# Patient Record
Sex: Male | Born: 1995 | Race: Black or African American | Hispanic: No | Marital: Single | Smoking: Never smoker
Health system: Southern US, Community
[De-identification: ages and names within clinical notes are randomized; demographics above are authoritative.]

## PROBLEM LIST (undated history)

## (undated) DIAGNOSIS — J45909 Unspecified asthma, uncomplicated: Secondary | ICD-10-CM

## (undated) DIAGNOSIS — F909 Attention-deficit hyperactivity disorder, unspecified type: Secondary | ICD-10-CM

## (undated) HISTORY — PX: WISDOM TOOTH EXTRACTION: SHX21

---
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
## 2017-11-29 ENCOUNTER — Encounter (HOSPITAL_COMMUNITY): Payer: Self-pay | Admitting: Emergency Medicine

## 2017-11-29 ENCOUNTER — Ambulatory Visit (HOSPITAL_COMMUNITY)
Admission: EM | Admit: 2017-11-29 | Discharge: 2017-11-29 | Disposition: A | Discharge status: Home / Self Care | Payer: Medicaid Other | Attending: Physician Assistant | Admitting: Physician Assistant

## 2017-11-29 DIAGNOSIS — J4521 Mild intermittent asthma with (acute) exacerbation: Secondary | ICD-10-CM | POA: Diagnosis not present

## 2017-11-29 DIAGNOSIS — Z9109 Other allergy status, other than to drugs and biological substances: Secondary | ICD-10-CM | POA: Diagnosis not present

## 2017-11-29 HISTORY — DX: Unspecified asthma, uncomplicated: J45.909

## 2017-11-29 HISTORY — DX: Attention-deficit hyperactivity disorder, unspecified type: F90.9

## 2017-11-29 MED ORDER — PREDNISONE 5 MG (21) PO TBPK: ORAL_TABLET | ORAL | 0 refills | Status: DC

## 2017-11-29 NOTE — ED Provider Notes (Signed)
[facility]    CSN: [id number] Arrival date & time: 11/29/17  1605     History   Chief Complaint Chief Complaint  Patient presents with  . URI    HPI Ralph Sheppard is a 21 y.o. male.   Who carries a history of asthma presents with feelings of "cant breathe" x 1 week. It is worse in the am and late at night. He notes a mild cough without production. Mild clear sinus drainage. He has been using MDI which does help.       Past Medical History:  Diagnosis Date  . ADHD   . Asthma     Patient Active Problem List   Diagnosis Date Noted  . WEIGHT LOSS, ABNORMAL 06/30/2009  . PNEUMONIA, BILATERAL 02/09/2009  . URI 09/15/2008  . 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 ASTHMA 05/02/2007  . ADHD 03/13/2005  . 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 No pertinent surgical history.     Home Medications    Prior to Admission medications   Medication Sig Start Date End Date Taking? Authorizing Provider  predniSONE (STERAPRED UNI-PAK 21 TAB) 5 MG (21) TBPK tablet Take as directed on package for asthma exacerbation 11/29/17   Riki SheerYoung, Jerrianne Hartin G, PA-C    Family History Family History  Problem Relation Age of Onset  . Asthma Mother   . Diabetes Mother   . Diabetes Father   . Hypertension Father     Social History Social History   Tobacco Use  . Smoking status: Never Smoker  . Smokeless tobacco: Never Used  Substance Use Topics  . Alcohol use: Not on file  . Drug use: Yes    Frequency: 2.0 times per week     Allergies   Patient has no known allergies.   Review of Systems Review of Systems  Constitutional: Positive for fatigue. Negative for chills and fever.  HENT: Positive for postnasal drip and rhinorrhea. Negative for congestion and sinus pain.   Eyes: Negative for discharge.  Respiratory: Positive for cough and shortness of breath. Negative for apnea and wheezing.   Musculoskeletal: Negative for back pain.    Skin: Negative.   Neurological: Negative for light-headedness.  Psychiatric/Behavioral: Negative.      Physical Exam Triage Vital Signs ED Triage Vitals [11/29/17 1709]  Enc Vitals Group     BP (!) 121/57     Pulse Rate 67     Resp      Temp 98.7 F (37.1 C)     Temp Source Oral     SpO2 100 %     Weight      Height      Head Circumference      Peak Flow      Pain Score      Pain Loc      Pain Edu?      Excl. in GC?    No data found.  Updated Vital Signs BP (!) 121/57 (BP Location: Left Arm)   Pulse 67   Temp 98.7 F (37.1 C) (Oral)   SpO2 100%   Visual Acuity Right Eye Distance:   Left Eye Distance:   Bilateral Distance:    Right Eye Near:   Left Eye Near:    Bilateral Near:     Physical Exam  Constitutional: He is oriented to person, place, and time. He appears well-developed and well-nourished.  HENT:  Right Ear: External ear normal.  Left Ear: External ear normal.  Mouth/Throat: Oropharynx is clear and moist.  Cardiovascular: Normal rate and regular rhythm.  Pulmonary/Chest: Effort normal.  Very fine exp wheeze in extreme bases, no crackles  Neurological: He is alert and oriented to person, place, and time.  Skin: Skin is warm and dry.  Psychiatric: His behavior is normal.  Nursing note and vitals reviewed.    UC Treatments / Results  Labs (all labs ordered are listed, but only abnormal results are displayed) Labs Reviewed - No data to display  EKG  EKG Interpretation None       Radiology No results found.  Procedures Procedures (including critical care time)  Medications Ordered in UC Medications - No data to display   Initial Impression / Assessment and Plan / UC Course  I have reviewed the triage vital signs and the nursing notes.  Pertinent labs & imaging results that were available during my care of the patient were reviewed by me and considered in my medical decision making (see chart for details).    No respiratory  compromise is noted. Treat with short course of prednisone, start daily Zyrtec. Delsym for cough and continued use of MDI. He needs to establish PCP care for chronic asthma needs. FU here if needed.   Final Clinical Impressions(s) / UC Diagnoses   Final diagnoses:  Mild intermittent asthma with acute exacerbation  Environmental allergies    ED Discharge Orders        Ordered    predniSONE (STERAPRED UNI-PAK 21 TAB) 5 MG (21) TBPK tablet     11/29/17 1751       Controlled Substance Prescriptions [place] Controlled Substance Registry consulted? Not Applicable   Riki SheerYoung, Elda Dunkerson G, PA-C 11/29/17 1756

## 2017-11-29 NOTE — Discharge Instructions (Signed)
Start Zyrtec daily for prevention. Continue to use inhaler every 6 hours as needed. Take the prednisone to help with current symptoms. If any worsening symptoms f/u but overall suggest establishment with a PCP to manage this. Stay hydrated and feel better. May use Delsym OTC for cough every 12 hours.

## 2017-11-29 NOTE — ED Triage Notes (Signed)
Patient c/o nonproductive cough that is hurting his chest and causing sob x 1 week. Pt reports he has been taking over the counter cold medication at night to help sleep. Pt is in NAD.

## 2017-12-18 ENCOUNTER — Ambulatory Visit (HOSPITAL_COMMUNITY)
Admission: EM | Admit: 2017-12-18 | Discharge: 2017-12-18 | Disposition: A | Discharge status: Home / Self Care | Payer: Medicaid Other

## 2017-12-18 ENCOUNTER — Encounter (HOSPITAL_COMMUNITY): Payer: Self-pay | Admitting: Emergency Medicine

## 2017-12-18 ENCOUNTER — Other Ambulatory Visit: Payer: Self-pay

## 2017-12-18 DIAGNOSIS — J4531 Mild persistent asthma with (acute) exacerbation: Secondary | ICD-10-CM | POA: Diagnosis not present

## 2017-12-18 DIAGNOSIS — R05 Cough: Secondary | ICD-10-CM | POA: Diagnosis not present

## 2017-12-18 DIAGNOSIS — J069 Acute upper respiratory infection, unspecified: Secondary | ICD-10-CM | POA: Diagnosis not present

## 2017-12-18 DIAGNOSIS — R062 Wheezing: Secondary | ICD-10-CM

## 2017-12-18 DIAGNOSIS — R0602 Shortness of breath: Secondary | ICD-10-CM

## 2017-12-18 MED ORDER — TRIAMCINOLONE ACETONIDE 40 MG/ML IJ SUSP
INTRAMUSCULAR | Status: AC
  Filled 2017-12-18: qty 1

## 2017-12-18 MED ORDER — ALBUTEROL SULFATE (2.5 MG/3ML) 0.083% IN NEBU
INHALATION_SOLUTION | RESPIRATORY_TRACT | Status: AC
  Filled 2017-12-18: qty 3

## 2017-12-18 MED ORDER — PREDNISONE 20 MG PO TABS: ORAL_TABLET | ORAL | 0 refills | Status: DC

## 2017-12-18 MED ORDER — TRIAMCINOLONE ACETONIDE 40 MG/ML IJ SUSP
40.0000 mg | Freq: Once | INTRAMUSCULAR | Status: AC
  Administered 2017-12-18: 40 mg via INTRAMUSCULAR

## 2017-12-18 MED ORDER — ALBUTEROL SULFATE (2.5 MG/3ML) 0.083% IN NEBU
2.5000 mg | INHALATION_SOLUTION | Freq: Once | RESPIRATORY_TRACT | Status: AC
  Administered 2017-12-18: 2.5 mg via RESPIRATORY_TRACT

## 2017-12-18 NOTE — Discharge Instructions (Addendum)
Take the prednisone as directed. Also take either Zyrtec or Allegra daily for drainage and sneezing. Use the albuterol inhaler 2 puffs every 4 hours as needed for shortness of breath or cough. Be sure to call your primary care provider to make an appointment for next week.

## 2017-12-18 NOTE — ED Provider Notes (Signed)
[facility]    CSN: [id number] Arrival date & time: 12/18/17  1621     History   Chief Complaint Chief Complaint  Patient presents with  . URI    HPI Ralph Sheppard is a 21 y.o. male.   21 year old male with a history of asthma presents with chief complaint of cough for 3 weeks. He has been using his inhaler more frequently. His also complaining of sneezing, runny nose and PND. He said he had a prednisone tablet that is been taking one daily at bedtime for one week. Uncertain as to the origin but it is in the record that he was seen earlier this month and given a Sterapred pack. He states he is using his a beer all inhaler 6-8 times per day.      Past Medical History:  Diagnosis Date  . ADHD   . Asthma     Patient Active Problem List   Diagnosis Date Noted  . WEIGHT LOSS, ABNORMAL 06/30/2009  . PNEUMONIA, BILATERAL 02/09/2009  . URI 09/15/2008  . 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 ASTHMA 05/02/2007  . ADHD 03/13/2005  . 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 No pertinent surgical history.     Home Medications    Prior to Admission medications   Medication Sig Start Date End Date Taking? Authorizing Provider  ALBUTEROL IN Inhale into the lungs.   Yes [provider]  cetirizine (ZYRTEC) 10 MG tablet Take 10 mg by mouth daily.   Yes [provider]  predniSONE (DELTASONE) 20 MG tablet Take 2 tabs daily for 5 days, then 1 tab daily for 5 days. Take with food. 12/18/17   Hayden RasmussenMabe, Dashon Mcintire, NP    Family History Family History  Problem Relation Age of Onset  . Asthma Mother   . Diabetes Mother   . Diabetes Father   . Hypertension Father     Social History Social History   Tobacco Use  . Smoking status: Never Smoker  . Smokeless tobacco: Never Used  Substance Use Topics  . Alcohol use: Not on file  . Drug use: Yes    Frequency: 2.0 times per week     Allergies   Patient has no known  allergies.   Review of Systems Review of Systems  Constitutional: Negative.   HENT:       As per history of present illness  Respiratory: Positive for cough, shortness of breath and wheezing.   Gastrointestinal: Negative.   Neurological: Negative.   All other systems reviewed and are negative.    Physical Exam Triage Vital Signs ED Triage Vitals  Enc Vitals Group     BP 12/18/17 1742 123/63     Pulse Rate 12/18/17 1742 61     Resp 12/18/17 1742 18     Temp 12/18/17 1742 98.7 F (37.1 C)     Temp Source 12/18/17 1742 Oral     SpO2 12/18/17 1742 100 %     Weight 12/18/17 1744 140 lb 4 oz (63.6 kg)     Height --      Head Circumference --      Peak Flow --      Pain Score --      Pain Loc --      Pain Edu? --      Excl. in GC? --    No data found.  Updated Vital Signs BP 123/63 (BP Location: Left Arm)   Pulse 61  Temp 98.7 F (37.1 C) (Oral)   Resp 18   Wt 140 lb 4 oz (63.6 kg)   SpO2 100%   Visual Acuity Right Eye Distance:   Left Eye Distance:   Bilateral Distance:    Right Eye Near:   Left Eye Near:    Bilateral Near:     Physical Exam  Constitutional: He is oriented to person, place, and time. No distress.  HENT:  Oropharynx with minor erythema, clear PND. No exudate  Eyes: EOM are normal. Pupils are equal, round, and reactive to light.  Neck: Normal range of motion. Neck supple.  Cardiovascular: Normal rate, regular rhythm and normal heart sounds.  Pulmonary/Chest: Effort normal and breath sounds normal. No respiratory distress.  Tidal volume is clear. Able to take deep breaths. Expiratory phase equals expiratory phase. No wheezes except when coughing.  Musculoskeletal: Normal range of motion. He exhibits no edema.  Lymphadenopathy:    He has no cervical adenopathy.  Neurological: He is alert and oriented to person, place, and time.  Skin: Skin is warm and dry.  Nursing note and vitals reviewed.    UC Treatments / Results  Labs (all labs  ordered are listed, but only abnormal results are displayed) Labs Reviewed - No data to display  EKG  EKG Interpretation None       Radiology No results found.  Procedures Procedures (including critical care time)  Medications Ordered in UC Medications  albuterol (PROVENTIL) (2.5 MG/3ML) 0.083% nebulizer solution 2.5 mg (2.5 mg Nebulization Given 12/18/17 1844)  triamcinolone acetonide (KENALOG-40) injection 40 mg (40 mg Intramuscular Given 12/18/17 1845)     Initial Impression / Assessment and Plan / UC Course  I have reviewed the triage vital signs and the nursing notes.  Pertinent labs & imaging results that were available during my care of the patient were reviewed by me and considered in my medical decision making (see chart for details).    Take the prednisone as directed. Also take either Zyrtec or Allegra daily for drainage and sneezing. Use the albuterol inhaler 2 puffs every 4 hours as needed for shortness of breath or cough. Be sure to call your primary care provider to make an appointment for next week.  Post albuterol neb lungs are clear. No wheezing.  Final Clinical Impressions(s) / UC Diagnoses   Final diagnoses:  Acute upper respiratory infection  Mild persistent asthma with acute exacerbation    ED Discharge Orders        Ordered    predniSONE (DELTASONE) 20 MG tablet     12/18/17 1848       Controlled Substance Prescriptions [place] Controlled Substance Registry consulted? Not Applicable   Hayden RasmussenMabe, Gail Vendetti, NP 12/18/17 [phone]

## 2017-12-18 NOTE — ED Triage Notes (Signed)
Complains of cough.  Cough started 2 weeks ago.  Patient complains of sneezing, runny nose, heavy breathing, and using inhalers more often

## 2018-11-10 ENCOUNTER — Encounter (HOSPITAL_COMMUNITY): Payer: Self-pay | Admitting: Emergency Medicine

## 2018-11-10 ENCOUNTER — Emergency Department (HOSPITAL_COMMUNITY)
Admission: EM | Admit: 2018-11-10 | Discharge: 2018-11-10 | Disposition: A | Discharge status: Home / Self Care | Payer: Medicaid Other | Attending: Emergency Medicine | Admitting: Emergency Medicine

## 2018-11-10 ENCOUNTER — Other Ambulatory Visit: Payer: Self-pay

## 2018-11-10 DIAGNOSIS — B9789 Other viral agents as the cause of diseases classified elsewhere: Secondary | ICD-10-CM

## 2018-11-10 DIAGNOSIS — J069 Acute upper respiratory infection, unspecified: Secondary | ICD-10-CM

## 2018-11-10 DIAGNOSIS — J45909 Unspecified asthma, uncomplicated: Secondary | ICD-10-CM | POA: Insufficient documentation

## 2018-11-10 NOTE — ED Triage Notes (Signed)
Pt has a cough x 4 days. Stated that he has been using OTC meds with good results. He needs to be cleared to return to work

## 2018-11-10 NOTE — Discharge Instructions (Signed)
Your symptoms are likely caused by a viral upper respiratory infection. Antibiotics are not helpful in treating viral infection, the virus should run its course in about 5-7 days. Please make sure you are drinking plenty of fluids. You can treat your symptoms supportively with tylenol/ibuprofen for fevers and pains, Zyrtec and Flonase to help with nasal congestion, and over the counter cough syrups and throat lozenges to help with cough. If your symptoms are not improving please follow up with you Primary doctor.   If you develop persistent fevers, shortness of breath or difficulty breathing, chest pain, severe headache and neck pain, persistent nausea and vomiting or other new or concerning symptoms return to the Emergency department.  

## 2018-11-10 NOTE — ED Provider Notes (Signed)
[facility]-EMERGENCY DEPT Provider Note   CSN: [id number] Arrival date & time: 11/10/18  1505     History   Chief Complaint Chief Complaint  Patient presents with  . Cough    HPI Ralph Sheppard is a 22 y.o. male.  Ralph Sheppard is a 22 y.o. Male with a history of ADHD and asthma who presents to the ED for evaluation of cough.  Reports he has had cough and nasal congestion for the past 4 days, it was much worse over the weekend but has been improving today.  He reports cough is nonproductive.  Some associated congestion, no sore throat chest pain or shortness of breath, no abdominal pain, nausea or vomiting.  He has not had any fevers or chills.  Reports he has been using over-the-counter medications such as Mucinex with good improvement in his symptoms and is requesting a note saying he can return to work today.     Past Medical History:  Diagnosis Date  . ADHD   . Asthma     Patient Active Problem List   Diagnosis Date Noted  . WEIGHT LOSS, ABNORMAL 06/30/2009  . PNEUMONIA, BILATERAL 02/09/2009  . URI 09/15/2008  . 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 ASTHMA 05/02/2007  . ADHD 03/13/2005  . 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 WISDOM TOOTH EXTRACTION          Home Medications    Prior to Admission medications   Medication Sig Start Date End Date Taking? Authorizing Provider  ALBUTEROL IN Inhale into the lungs.    [provider]  cetirizine (ZYRTEC) 10 MG tablet Take 10 mg by mouth daily.    [provider]  predniSONE (DELTASONE) 20 MG tablet Take 2 tabs daily for 5 days, then 1 tab daily for 5 days. Take with food. 12/18/17   Hayden RasmussenMabe, David, NP    Family History Family History  Problem Relation Age of Onset  . Asthma Mother   . Diabetes Mother   . Diabetes Father   . Hypertension Father     Social History Social History   Tobacco Use  .  Smoking status: Never Smoker  . Smokeless tobacco: Never Used  Substance Use Topics  . Alcohol use: Never    Frequency: Never  . Drug use: Never     Allergies   Patient has no known allergies.   Review of Systems Review of Systems  Constitutional: Negative for chills and fever.  HENT: Positive for congestion, postnasal drip and sneezing. Negative for ear pain, rhinorrhea, sinus pressure and sore throat.   Respiratory: Positive for cough. Negative for shortness of breath.   Cardiovascular: Negative for chest pain.  Gastrointestinal: Negative for abdominal pain and nausea.  Musculoskeletal: Negative for arthralgias and myalgias.  Skin: Negative for color change and rash.  Neurological: Negative for headaches.     Physical Exam Updated Vital Signs BP 117/77 (BP Location: Left Arm)   Pulse 68   Temp 98.5 F (36.9 C) (Oral)   Resp 14   Ht 5\' 9"  (1.753 m)   Wt 63.5 kg   SpO2 100%   BMI 20.67 kg/m   Physical Exam  Constitutional: He appears well-developed and well-nourished. He does not appear ill. No distress.  HENT:  Head: Normocephalic and atraumatic.  Mouth/Throat: Oropharynx is clear and moist.  TMs clear with good landmarks, moderate nasal mucosa edema with clear rhinorrhea, posterior oropharynx clear and  moist, with some erythema, no edema or exudates, uvula midline  Eyes: Right eye exhibits no discharge. Left eye exhibits no discharge.  Neck: Neck supple.  No rigidity  Cardiovascular: Normal rate, regular rhythm, normal heart sounds and intact distal pulses.  Pulmonary/Chest: Effort normal and breath sounds normal. No respiratory distress.  Respirations equal and unlabored, patient able to speak in full sentences, lungs clear to auscultation bilaterally, occasional cough during exam  Abdominal: Soft. Bowel sounds are normal. He exhibits no distension and no mass. There is no tenderness. There is no guarding.  Abdomen soft, nondistended, nontender to palpation in  all quadrants without guarding or peritoneal signs  Musculoskeletal: He exhibits no deformity.  Lymphadenopathy:    He has no cervical adenopathy.  Neurological: He is alert.  Skin: Skin is warm and dry. Capillary refill takes less than 2 seconds. He is not diaphoretic.  Nursing note and vitals reviewed.    ED Treatments / Results  Labs (all labs ordered are listed, but only abnormal results are displayed) Labs Reviewed - No data to display  EKG None  Radiology No results found.  Procedures Procedures (including critical care time)  Medications Ordered in ED Medications - No data to display   Initial Impression / Assessment and Plan / ED Course  I have reviewed the triage vital signs and the nursing notes.  Pertinent labs & imaging results that were available during my care of the patient were reviewed by me and considered in my medical decision making (see chart for details).  Pt presents with nasal congestion and cough. Pt is well appearing and vitals are normal. Lungs CTA on exam.  Patients symptoms are consistent with URI, likely viral etiology. Discussed that antibiotics are not indicated for viral infections.  Symptoms already improving with over-the-counter medications at home, patient requesting notes that he can return to work.  Pt will be discharged with continued symptomatic treatment.  Verbalizes understanding and is agreeable with plan. Pt is hemodynamically stable & in NAD prior to dc.   Final Clinical Impressions(s) / ED Diagnoses   Final diagnoses:  Viral URI with cough    ED Discharge Orders    None       Legrand Rams 11/10/18 1545    Azalia Bilis, MD 11/10/18 [id number]

## 2018-11-11 ENCOUNTER — Ambulatory Visit (HOSPITAL_COMMUNITY)
Admission: EM | Admit: 2018-11-11 | Discharge: 2018-11-11 | Disposition: A | Discharge status: Home / Self Care | Payer: Self-pay | Attending: Family Medicine | Admitting: Family Medicine

## 2018-11-11 ENCOUNTER — Encounter (HOSPITAL_COMMUNITY): Payer: Self-pay | Admitting: Emergency Medicine

## 2018-11-11 DIAGNOSIS — J069 Acute upper respiratory infection, unspecified: Secondary | ICD-10-CM

## 2018-11-11 DIAGNOSIS — B9789 Other viral agents as the cause of diseases classified elsewhere: Secondary | ICD-10-CM

## 2018-11-11 MED ORDER — HYDROCODONE-HOMATROPINE 5-1.5 MG/5ML PO SYRP: 5.0000 mL | ORAL_SOLUTION | Freq: Four times a day (QID) | ORAL | 0 refills | Status: DC | PRN

## 2018-11-11 NOTE — ED Provider Notes (Signed)
[facility]   [id number] 11/11/18 Arrival Time: 1214  ASSESSMENT & PLAN:  1. Viral URI with cough   No concern for pneumonia. Discussed.  Meds ordered this encounter  Medications  . HYDROcodone-homatropine (HYCODAN) 5-1.5 MG/5ML syrup    Sig: Take 5 mLs by mouth every 6 (six) hours as needed for cough.    Dispense:  90 mL    Refill:  0   Cough medication sedation precautions. Discussed typical duration of symptoms. OTC symptom care as needed. Ensure adequate fluid intake and rest. May f/u with PCP or here as needed.  Reviewed expectations re: course of current medical issues. Questions answered. Outlined signs and symptoms indicating need for more acute intervention. Patient verbalized understanding. After Visit Summary given.   SUBJECTIVE: History from: patient and mother. Seen in ED yesterday. Note reviewed. No specific change in his symptoms. Cough is bothering him the most; affecting sleep.  Ralph Sheppard is a 22 y.o. male who presents with complaint of nasal congestion, post-nasal drainage, and a persistent dry cough. Onset abrupt, about 4 days ago. Overall with fatigue and with mild body aches. SOB: none. Wheezing: none. Fever: yes, questions subjective with chills. Overall normal PO intake without n/v. Sick contacts: no. No specific or significant aggravating or alleviating factors reported. OTC treatment: Mucinex without relief. No prescription medications.  Received flu shot this year: no.  Social History   Tobacco Use  Smoking Status Never Smoker  Smokeless Tobacco Never Used    ROS: As per HPI.   OBJECTIVE:  Vitals:   11/11/18 1348  BP: 115/74  Pulse: 60  Resp: 16  Temp: 98.2 F (36.8 C)  SpO2: 100%     General appearance: alert; appears fatigued HEENT: nasal congestion; clear runny nose; throat irritation secondary to post-nasal drainage Neck: supple without LAD CV: RRR Lungs: unlabored respirations, symmetrical air entry without  wheezing; cough: moderate Psychological: alert and cooperative; normal mood and affect   No Known Allergies  Past Medical History:  Diagnosis Date  . ADHD   . Asthma    Family History  Problem Relation Age of Onset  . Asthma Mother   . Diabetes Mother   . Diabetes Father   . Hypertension Father    Social History   Socioeconomic History  . Marital status: Single    Spouse name: Not on file  . Number of children: Not on file  . Years of education: Not on file  . Highest education level: Not on file  Occupational History  . Not on file  Social Needs  . Financial resource strain: Not on file  . Food insecurity:    Worry: Not on file    Inability: Not on file  . Transportation needs:    Medical: Not on file    Non-medical: Not on file  Tobacco Use  . Smoking status: Never Smoker  . Smokeless tobacco: Never Used  Substance and Sexual Activity  . Alcohol use: Never    Frequency: Never  . Drug use: Never  . Sexual activity: Not on file  Lifestyle  . Physical activity:    Days per week: Not on file    Minutes per session: Not on file  . Stress: Not on file  Relationships  . Social connections:    Talks on phone: Not on file    Gets together: Not on file    Attends religious service: Not on file    Active member of club or organization: Not on file  Attends meetings of clubs or organizations: Not on file    Relationship status: Not on file  . Intimate partner violence:    Fear of current or ex partner: Not on file    Emotionally abused: Not on file    Physically abused: Not on file    Forced sexual activity: Not on file  Other Topics Concern  . Not on file  Social History Narrative  . Not on file           Mardella Layman, MD 11/11/18 1421

## 2018-11-11 NOTE — Discharge Instructions (Signed)
Be aware, your cough medication may cause drowsiness. Please do not drive, operate heavy machinery or make important decisions while on this medication, it can cloud your judgement.  Follow up with your primary care doctor or here if you are not seeing improvement of your symptoms over the next several days, sooner if you feel you are worsening.  Caring for yourself: Get plenty of rest. Drink plenty of fluids, enough so that your urine is light yellow or clear like water. If you have kidney, heart, or liver disease and have to limit fluids, talk with your doctor before you increase the amount of fluids you drink. Take an over-the-counter pain medicine if needed, such as acetaminophen (Tylenol), ibuprofen (Advil, Motrin), or naproxen (Aleve), to relieve fever, headache, and muscle aches. Read and follow all instructions on the label. No one younger than 20 should take aspirin. It has been linked to Reye syndrome, a serious illness. Before you use over the counter cough and cold medicines, check the label. These medicines may not be safe for children younger than age 6 or for people with certain health problems. If the skin around your nose and lips becomes sore, put some petroleum jelly on the area.  Avoid spreading a virus: Wash your hands regularly, and keep your hands away from your face.  Stay home from school, work, and other public places until you are feeling better and your fever has been gone for at least 24 hours. The fever needs to have gone away on its own without the help of medicine.  

## 2018-11-11 NOTE — ED Triage Notes (Signed)
Pt c/o cough x1 week, was seen in ER for the same yseterday.

## 2019-12-14 ENCOUNTER — Ambulatory Visit (HOSPITAL_COMMUNITY)
Admission: EM | Admit: 2019-12-14 | Discharge: 2019-12-14 | Disposition: A | Discharge status: Home / Self Care | Payer: HRSA Program | Attending: Family Medicine | Admitting: Family Medicine

## 2019-12-14 ENCOUNTER — Other Ambulatory Visit: Payer: Self-pay

## 2019-12-14 ENCOUNTER — Encounter (HOSPITAL_COMMUNITY): Payer: Self-pay

## 2019-12-14 DIAGNOSIS — Z20822 Contact with and (suspected) exposure to covid-19: Secondary | ICD-10-CM

## 2019-12-14 DIAGNOSIS — Z20828 Contact with and (suspected) exposure to other viral communicable diseases: Secondary | ICD-10-CM | POA: Insufficient documentation

## 2019-12-14 NOTE — ED Triage Notes (Addendum)
Pt. States his dad tested POSITIVE yesterday for COVID & needs to be tested because of them living in the same household. Needs a work note saying he was tested, he denies ANY symptoms.

## 2019-12-14 NOTE — Discharge Instructions (Signed)
Person Under Monitoring Name: Ralph Sheppard  Location: [place] [address]   Infection Prevention Recommendations for Individuals Confirmed to have, or Being Evaluated for, 2019 Novel Coronavirus (COVID-19) Infection Who Receive Care at Home  Individuals who are confirmed to have, or are being evaluated for, COVID-19 should follow the prevention steps below until a healthcare provider or local or state health department says they can return to normal activities.  Stay home except to get medical care You should restrict activities outside your home, except for getting medical care. Do not go to work, school, or public areas, and do not use public transportation or taxis.  Call ahead before visiting your doctor Before your medical appointment, call the healthcare provider and tell them that you have, or are being evaluated for, COVID-19 infection. This will help the healthcare provider's office take steps to keep other people from getting infected. Ask your healthcare provider to call the local or state health department.  Monitor your symptoms Seek prompt medical attention if your illness is worsening (e.g., difficulty breathing). Before going to your medical appointment, call the healthcare provider and tell them that you have, or are being evaluated for, COVID-19 infection. Ask your healthcare provider to call the local or state health department.  Wear a facemask You should wear a facemask that covers your nose and mouth when you are in the same room with other people and when you visit a healthcare provider. People who live with or visit you should also wear a facemask while they are in the same room with you.  Separate yourself from other people in your home As much as possible, you should stay in a different room from other people in your home. Also, you should use a separate bathroom, if available.  Avoid sharing household items You should not share  dishes, drinking glasses, cups, eating utensils, towels, bedding, or other items with other people in your home. After using these items, you should wash them thoroughly with soap and water.  Cover your coughs and sneezes Cover your mouth and nose with a tissue when you cough or sneeze, or you can cough or sneeze into your sleeve. Throw used tissues in a lined trash can, and immediately wash your hands with soap and water for at least 20 seconds or use an alcohol-based hand rub.  Wash your [facility] your hands often and thoroughly with soap and water for at least 20 seconds. You can use an alcohol-based hand sanitizer if soap and water are not available and if your hands are not visibly dirty. Avoid touching your eyes, nose, and mouth with unwashed hands.   Prevention Steps for Caregivers and Household Members of Individuals Confirmed to have, or Being Evaluated for, COVID-19 Infection Being Cared for in the Home  If you live with, or provide care at home for, a person confirmed to have, or being evaluated for, COVID-19 infection please follow these guidelines to prevent infection:  Follow healthcare provider's instructions Make sure that you understand and can help the patient follow any healthcare provider instructions for all care.  Provide for the patient's basic needs You should help the patient with basic needs in the home and provide support for getting groceries, prescriptions, and other personal needs.  Monitor the patient's symptoms If they are getting sicker, call his or her medical provider and tell them that the patient has, or is being evaluated for, COVID-19 infection. This will help the healthcare provider's office  take steps to keep other people from getting infected. Ask the healthcare provider to call the local or state health department.  Limit the number of people who have contact with the patient If possible, have only one caregiver for the patient. Other  household members should stay in another home or place of residence. If this is not possible, they should stay in another room, or be separated from the patient as much as possible. Use a separate bathroom, if available. Restrict visitors who do not have an essential need to be in the home.  Keep older adults, very young children, and other sick people away from the patient Keep older adults, very young children, and those who have compromised immune systems or chronic health conditions away from the patient. This includes people with chronic heart, lung, or kidney conditions, diabetes, and cancer.  Ensure good ventilation Make sure that shared spaces in the home have good air flow, such as from an air conditioner or an opened window, weather permitting.  Wash your hands often Wash your hands often and thoroughly with soap and water for at least 20 seconds. You can use an alcohol based hand sanitizer if soap and water are not available and if your hands are not visibly dirty. Avoid touching your eyes, nose, and mouth with unwashed hands. Use disposable paper towels to dry your hands. If not available, use dedicated cloth towels and replace them when they become wet.  Wear a facemask and gloves Wear a disposable facemask at all times in the room and gloves when you touch or have contact with the patient's blood, body fluids, and/or secretions or excretions, such as sweat, saliva, sputum, nasal mucus, vomit, urine, or feces.  Ensure the mask fits over your nose and mouth tightly, and do not touch it during use. Throw out disposable facemasks and gloves after using them. Do not reuse. Wash your hands immediately after removing your facemask and gloves. If your personal clothing becomes contaminated, carefully remove clothing and launder. Wash your hands after handling contaminated clothing. Place all used disposable facemasks, gloves, and other waste in a lined container before disposing them with  other household waste. Remove gloves and wash your hands immediately after handling these items.  Do not share dishes, glasses, or other household items with the patient Avoid sharing household items. You should not share dishes, drinking glasses, cups, eating utensils, towels, bedding, or other items with a patient who is confirmed to have, or being evaluated for, COVID-19 infection. After the person uses these items, you should wash them thoroughly with soap and water.  Wash laundry thoroughly Immediately remove and wash clothes or bedding that have blood, body fluids, and/or secretions or excretions, such as sweat, saliva, sputum, nasal mucus, vomit, urine, or feces, on them. Wear gloves when handling laundry from the patient. Read and follow directions on labels of laundry or clothing items and detergent. In general, wash and dry with the warmest temperatures recommended on the label.  Clean all areas the individual has used often Clean all touchable surfaces, such as counters, tabletops, doorknobs, bathroom fixtures, toilets, phones, keyboards, tablets, and bedside tables, every day. Also, clean any surfaces that may have blood, body fluids, and/or secretions or excretions on them. Wear gloves when cleaning surfaces the patient has come in contact with. Use a diluted bleach solution (e.g., dilute bleach with 1 part bleach and 10 parts water) or a household disinfectant with a label that says EPA-registered for coronaviruses. To make a bleach  solution at home, add 1 tablespoon of bleach to 1 quart (4 cups) of water. For a larger supply, add  cup of bleach to 1 gallon (16 cups) of water. Read labels of cleaning products and follow recommendations provided on product labels. Labels contain instructions for safe and effective use of the cleaning product including precautions you should take when applying the product, such as wearing gloves or eye protection and making sure you have good ventilation  during use of the product. Remove gloves and wash hands immediately after cleaning.  Monitor yourself for signs and symptoms of illness Caregivers and household members are considered close contacts, should monitor their health, and will be asked to limit movement outside of the home to the extent possible. Follow the monitoring steps for close contacts listed on the symptom monitoring form.   ? If you have additional questions, contact your local health department or call the epidemiologist on call at [phone] (available 24/7). ? This guidance is subject to change. For the most up-to-date guidance from [facility], please refer to their website: [email or web address]

## 2019-12-15 NOTE — ED Provider Notes (Signed)
[name]    CSN: [id number] Arrival date & time: 12/14/19  1346      History   Chief Complaint Chief Complaint  Patient presents with  . COVID exposure    HPI Ralph Sheppard is a 23 y.o. male history of asthma presenting today for Covid testing.  Patient has had exposure to Covid.  His dad tested positive for Covid recently and has been living with them.  He has not developed any symptoms of cough, congestion or sore throat.  Denies any fevers chills or body aches.  HPI  Past Medical History:  Diagnosis Date  . ADHD   . Asthma     Patient Active Problem List   Diagnosis Date Noted  . WEIGHT LOSS, ABNORMAL 06/30/2009  . PNEUMONIA, BILATERAL 02/09/2009  . URI 09/15/2008  . 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 ASTHMA 05/02/2007  . ADHD 03/13/2005  . 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 WISDOM TOOTH EXTRACTION         Home Medications    Prior to Admission medications   Medication Sig Start Date End Date Taking? Authorizing Provider  ALBUTEROL IN Inhale into the lungs.    [provider]  cetirizine (ZYRTEC) 10 MG tablet Take 10 mg by mouth daily.    [provider]  HYDROcodone-homatropine (HYCODAN) 5-1.5 MG/5ML syrup Take 5 mLs by mouth every 6 (six) hours as needed for cough. 11/11/18   Vanessa Kick, MD  predniSONE (DELTASONE) 20 MG tablet Take 2 tabs daily for 5 days, then 1 tab daily for 5 days. Take with food. Patient not taking: Reported on 11/11/2018 12/18/17   Janne Napoleon, NP    Family History Family History  Problem Relation Age of Onset  . Asthma Mother   . Diabetes Mother   . Diabetes Father   . Hypertension Father     Social History Social History   Tobacco Use  . Smoking status: Never Smoker  . Smokeless tobacco: Never Used  Substance Use Topics  . Alcohol use: Never  . Drug use: Never     Allergies   Patient has no known  allergies.   Review of Systems Review of Systems  Constitutional: Negative for activity change, appetite change, chills, fatigue and fever.  HENT: Negative for congestion, ear pain, rhinorrhea, sinus pressure, sore throat and trouble swallowing.   Eyes: Negative for discharge and redness.  Respiratory: Negative for cough, chest tightness and shortness of breath.   Cardiovascular: Negative for chest pain.  Gastrointestinal: Negative for abdominal pain, diarrhea, nausea and vomiting.  Musculoskeletal: Negative for myalgias.  Skin: Negative for rash.  Neurological: Negative for dizziness, light-headedness and headaches.     Physical Exam Triage Vital Signs ED Triage Vitals  Enc Vitals Group     BP 12/14/19 1416 108/76     Pulse Rate 12/14/19 1416 64     Resp 12/14/19 1416 15     Temp 12/14/19 1416 98.7 F (37.1 C)     Temp Source 12/14/19 1416 Oral     SpO2 12/14/19 1416 99 %     Weight 12/14/19 1412 70 lb 12.8 oz (32.1 kg)     Height --      Head Circumference --      Peak Flow --      Pain Score 12/14/19 1411 0     Pain Loc --      Pain Edu? --  Excl. in [place]? --    No data found.  Updated Vital Signs BP 108/76 (BP Location: Right Arm)   Pulse 64   Temp 98.7 F (37.1 C) (Oral)   Resp 15   Wt 70 lb 12.8 oz (32.1 kg)   SpO2 99%   BMI 10.46 kg/m   Visual Acuity Right Eye Distance:   Left Eye Distance:   Bilateral Distance:    Right Eye Near:   Left Eye Near:    Bilateral Near:     Physical Exam Vitals and nursing note reviewed.  Constitutional:      Appearance: He is well-developed.     Comments: No acute distress  HENT:     Head: Normocephalic and atraumatic.     Nose: Nose normal.  Eyes:     Conjunctiva/sclera: Conjunctivae normal.  Cardiovascular:     Rate and Rhythm: Normal rate.  Pulmonary:     Effort: Pulmonary effort is normal. No respiratory distress.     Comments: Breathing comfortably at rest, CTABL, no wheezing, rales or other  adventitious sounds auscultated Abdominal:     General: There is no distension.  Musculoskeletal:        General: Normal range of motion.     Cervical back: Neck supple.  Skin:    General: Skin is warm and dry.  Neurological:     Mental Status: He is alert and oriented to person, place, and time.      UC Treatments / Results  Labs (all labs ordered are listed, but only abnormal results are displayed) Labs Reviewed  NOVEL CORONAVIRUS, NAA (HOSP ORDER, SEND-OUT TO REF LAB; TAT 18-24 HRS)    EKG   Radiology No results found.  Procedures Procedures (including critical care time)  Medications Ordered in UC Medications - No data to display  Initial Impression / Assessment and Plan / UC Course  I have reviewed the triage vital signs and the nursing notes.  Pertinent labs & imaging results that were available during my care of the patient were reviewed by me and considered in my medical decision making (see chart for details).    Covid PCR pending.  Recommended quarantining with dad.  Discussed possibility of false negatives shortly after exposure and recommended continuing close monitoring for development of symptoms.  Advised it would be best/safest to quarantine for the next 10 days.Discussed strict return precautions. Patient verbalized understanding and is agreeable with plan.   Final Clinical Impressions(s) / UC Diagnoses   Final diagnoses:  Exposure to COVID-19 virus     Discharge Instructions        Person Under Monitoring Name: Ralph Sheppard  Location: [address] [place] [address]   Infection Prevention Recommendations for Individuals Confirmed to have, or Being Evaluated for, 2019 Novel Coronavirus (COVID-19) Infection Who Receive Care at Home  Individuals who are confirmed to have, or are being evaluated for, COVID-19 should follow the prevention steps below until a healthcare provider or local or state health department says they can return  to normal activities.  Stay home except to get medical care You should restrict activities outside your home, except for getting medical care. Do not go to work, school, or public areas, and do not use public transportation or taxis.  Call ahead before visiting your doctor Before your medical appointment, call the healthcare provider and tell them that you have, or are being evaluated for, COVID-19 infection. This will help the healthcare provider's office take steps to keep other  people from getting infected. Ask your healthcare provider to call the local or state health department.  Monitor your symptoms Seek prompt medical attention if your illness is worsening (e.g., difficulty breathing). Before going to your medical appointment, call the healthcare provider and tell them that you have, or are being evaluated for, COVID-19 infection. Ask your healthcare provider to call the local or state health department.  Wear a facemask You should wear a facemask that covers your nose and mouth when you are in the same room with other people and when you visit a healthcare provider. People who live with or visit you should also wear a facemask while they are in the same room with you.  Separate yourself from other people in your home As much as possible, you should stay in a different room from other people in your home. Also, you should use a separate bathroom, if available.  Avoid sharing household items You should not share dishes, drinking glasses, cups, eating utensils, towels, bedding, or other items with other people in your home. After using these items, you should wash them thoroughly with soap and water.  Cover your coughs and sneezes Cover your mouth and nose with a tissue when you cough or sneeze, or you can cough or sneeze into your sleeve. Throw used tissues in a lined trash can, and immediately wash your hands with soap and water for at least 20 seconds or use an alcohol-based  hand rub.  Wash your [employer] your hands often and thoroughly with soap and water for at least 20 seconds. You can use an alcohol-based hand sanitizer if soap and water are not available and if your hands are not visibly dirty. Avoid touching your eyes, nose, and mouth with unwashed hands.   Prevention Steps for Caregivers and Household Members of Individuals Confirmed to have, or Being Evaluated for, COVID-19 Infection Being Cared for in the Home  If you live with, or provide care at home for, a person confirmed to have, or being evaluated for, COVID-19 infection please follow these guidelines to prevent infection:  Follow healthcare provider's instructions Make sure that you understand and can help the patient follow any healthcare provider instructions for all care.  Provide for the patient's basic needs You should help the patient with basic needs in the home and provide support for getting groceries, prescriptions, and other personal needs.  Monitor the patient's symptoms If they are getting sicker, call his or her medical provider and tell them that the patient has, or is being evaluated for, COVID-19 infection. This will help the healthcare provider's office take steps to keep other people from getting infected. Ask the healthcare provider to call the local or state health department.  Limit the number of people who have contact with the patient  If possible, have only one caregiver for the patient.  Other household members should stay in another home or place of residence. If this is not possible, they should stay  in another room, or be separated from the patient as much as possible. Use a separate bathroom, if available.  Restrict visitors who do not have an essential need to be in the home.  Keep older adults, very young children, and other sick people away from the patient Keep older adults, very young children, and those who have compromised immune systems or  chronic health conditions away from the patient. This includes people with chronic heart, lung, or kidney conditions, diabetes, and cancer.  Ensure good ventilation Make  sure that shared spaces in the home have good air flow, such as from an air conditioner or an opened window, weather permitting.  Wash your hands often  Wash your hands often and thoroughly with soap and water for at least 20 seconds. You can use an alcohol based hand sanitizer if soap and water are not available and if your hands are not visibly dirty.  Avoid touching your eyes, nose, and mouth with unwashed hands.  Use disposable paper towels to dry your hands. If not available, use dedicated cloth towels and replace them when they become wet.  Wear a facemask and gloves  Wear a disposable facemask at all times in the room and gloves when you touch or have contact with the patient's blood, body fluids, and/or secretions or excretions, such as sweat, saliva, sputum, nasal mucus, vomit, urine, or feces.  Ensure the mask fits over your nose and mouth tightly, and do not touch it during use.  Throw out disposable facemasks and gloves after using them. Do not reuse.  Wash your hands immediately after removing your facemask and gloves.  If your personal clothing becomes contaminated, carefully remove clothing and launder. Wash your hands after handling contaminated clothing.  Place all used disposable facemasks, gloves, and other waste in a lined container before disposing them with other household waste.  Remove gloves and wash your hands immediately after handling these items.  Do not share dishes, glasses, or other household items with the patient  Avoid sharing household items. You should not share dishes, drinking glasses, cups, eating utensils, towels, bedding, or other items with a patient who is confirmed to have, or being evaluated for, COVID-19 infection.  After the person uses these items, you should wash them  thoroughly with soap and water.  Wash laundry thoroughly  Immediately remove and wash clothes or bedding that have blood, body fluids, and/or secretions or excretions, such as sweat, saliva, sputum, nasal mucus, vomit, urine, or feces, on them.  Wear gloves when handling laundry from the patient.  Read and follow directions on labels of laundry or clothing items and detergent. In general, wash and dry with the warmest temperatures recommended on the label.  Clean all areas the individual has used often  Clean all touchable surfaces, such as counters, tabletops, doorknobs, bathroom fixtures, toilets, phones, keyboards, tablets, and bedside tables, every day. Also, clean any surfaces that may have blood, body fluids, and/or secretions or excretions on them.  Wear gloves when cleaning surfaces the patient has come in contact with.  Use a diluted bleach solution (e.g., dilute bleach with 1 part bleach and 10 parts water) or a household disinfectant with a label that says EPA-registered for coronaviruses. To make a bleach solution at home, add 1 tablespoon of bleach to 1 quart (4 cups) of water. For a larger supply, add  cup of bleach to 1 gallon (16 cups) of water.  Read labels of cleaning products and follow recommendations provided on product labels. Labels contain instructions for safe and effective use of the cleaning product including precautions you should take when applying the product, such as wearing gloves or eye protection and making sure you have good ventilation during use of the product.  Remove gloves and wash hands immediately after cleaning.  Monitor yourself for signs and symptoms of illness Caregivers and household members are considered close contacts, should monitor their health, and will be asked to limit movement outside of the home to the extent possible. Follow the monitoring steps  for close contacts listed on the symptom monitoring form.   ? If you have additional  questions, contact your local health department or call the epidemiologist on call at [phone] (available 24/7). ? This guidance is subject to change. For the most up-to-date guidance from [facility], please refer to their website: [email or web address]    ED Prescriptions    None     PDMP not reviewed this encounter.   Lew Dawes, PA-C 12/15/19 1009

## 2019-12-16 ENCOUNTER — Telehealth: Payer: Self-pay | Admitting: Emergency Medicine

## 2019-12-16 LAB — NOVEL CORONAVIRUS, NAA (HOSP ORDER, SEND-OUT TO REF LAB; TAT 18-24 HRS): SARS-CoV-2, NAA: NOT DETECTED

## 2019-12-16 NOTE — Telephone Encounter (Signed)
Pt father tested positive, Pt informed this negative may be too soon and to still quarantine and return at appropriate time for retesting. Pt verbalized understanding. All questions answered.

## 2019-12-21 ENCOUNTER — Ambulatory Visit (HOSPITAL_COMMUNITY)
Admission: EM | Admit: 2019-12-21 | Discharge: 2019-12-21 | Disposition: A | Discharge status: Home / Self Care | Payer: Self-pay

## 2019-12-21 ENCOUNTER — Other Ambulatory Visit: Payer: Self-pay

## 2019-12-21 NOTE — ED Notes (Signed)
Wants to [facility] to [facility] test site instead

## 2023-02-24 ENCOUNTER — Encounter (HOSPITAL_COMMUNITY): Payer: Self-pay | Admitting: *Deleted

## 2023-02-24 ENCOUNTER — Ambulatory Visit (HOSPITAL_COMMUNITY)
Admission: EM | Admit: 2023-02-24 | Discharge: 2023-02-24 | Disposition: A | Discharge status: Home / Self Care | Payer: Self-pay | Attending: Nurse Practitioner | Admitting: Nurse Practitioner

## 2023-02-24 ENCOUNTER — Other Ambulatory Visit: Payer: Self-pay

## 2023-02-24 DIAGNOSIS — J029 Acute pharyngitis, unspecified: Secondary | ICD-10-CM

## 2023-02-24 LAB — POCT RAPID STREP A, ED / UC: Streptococcus, Group A Screen (Direct): NEGATIVE

## 2023-02-24 NOTE — ED Triage Notes (Signed)
Pt reports a sore throat that started today.

## 2023-02-24 NOTE — Discharge Instructions (Addendum)
Pharyngitis is a sore throat. This is when there is redness, pain, and swelling in your throat. Most of the time, this condition gets better on its own. The test we performed today does not show strep which is a bacterial infection. Your sore throat is likely due to a virus. Antibiotic medicines are not prescribed for viral infections. This is because antibiotics are designed to kill bacteria. They do not kill viruses.  Try the following to help with your pain:  Sip warm liquids.  Eat or drink cold or frozen liquids Use throat lozenges  Gargle with warm salt water  Take ibuprofen or tylenol

## 2023-02-24 NOTE — ED Provider Notes (Signed)
[place]    CSN: [id number] Arrival date & time: 02/24/23  1844      History   Chief Complaint Chief Complaint  Patient presents with   Sore Throat    HPI Ralph Sheppard is a 27 y.o. male.   Subjective:   Ralph Sheppard is a 27 y.o. male who presents for evaluation of a sore throat that started this morning upon waking up and has been unchanged since that time. He also reports a dry cough for the past week. He denies any fever, chills, headache, myalgias, productive cough, nasal congestion, or sneezing. He is drinking plenty of fluids. Patient reports that he lives in a shelter with 30-40 other people but denies any known close exposure to someone with proven streptococcal pharyngitis.  The following portions of the patient's history were reviewed and updated as appropriate: allergies, current medications, past family history, past medical history, past social history, past surgical history, and problem list.       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 Authorizing Provider  ALBUTEROL IN Inhale into the lungs.    [provider]  cetirizine (ZYRTEC) 10 MG tablet Take 10 mg by mouth daily.    [provider]    Family History Family History  Problem Relation Age of Onset   Asthma Mother    Diabetes Mother    Diabetes Father    Hypertension Father     Social History Social History   Tobacco Use   Smoking status: Never   Smokeless tobacco: Never  Vaping Use    Vaping Use: Never used  Substance Use Topics   Alcohol use: Never   Drug use: Never     Allergies   Patient has no known allergies.   Review of Systems Review of Systems  Constitutional:  Negative for fever.  HENT:  Positive for sore throat. Negative for congestion, ear pain, postnasal drip, rhinorrhea, sinus pressure, sinus pain, sneezing and trouble swallowing.   Respiratory:  Positive for cough.   Neurological:  Negative for headaches.  All other systems reviewed and are negative.    Physical Exam Triage Vital Signs ED Triage Vitals  Enc Vitals Group     BP 02/24/23 1944 126/85     Pulse Rate 02/24/23 1944 73     Resp 02/24/23 1944 18     Temp 02/24/23 1944 99.5 F (37.5 C)     Temp src --      SpO2 02/24/23 1944 97 %     Weight --      Height --      Head Circumference --      Peak Flow --      Pain Score 02/24/23 1942 10     Pain Loc --      Pain Edu? --      Excl. in [place]? --    No data found.  Updated Vital Signs BP 126/85  Pulse 73   Temp 99.5 F (37.5 C)   Resp 18   SpO2 97%   Visual Acuity Right Eye Distance:   Left Eye Distance:   Bilateral Distance:    Right Eye Near:   Left Eye Near:    Bilateral Near:     Physical Exam Vitals reviewed.  Constitutional:      General: He is not in acute distress.    Appearance: He is well-developed. He is not ill-appearing, toxic-appearing or diaphoretic.  HENT:     Head: Normocephalic.     Mouth/Throat:     Mouth: Mucous membranes are moist.     Pharynx: Uvula midline. No pharyngeal swelling, oropharyngeal exudate, posterior oropharyngeal erythema or uvula swelling.     Tonsils: No tonsillar exudate or tonsillar abscesses.  Eyes:     Conjunctiva/sclera: Conjunctivae normal.  Cardiovascular:     Rate and Rhythm: Normal rate.     Heart sounds: Normal heart sounds.  Pulmonary:     Effort: Pulmonary effort is normal.  Musculoskeletal:     Cervical back: Normal range of motion and neck supple.   Lymphadenopathy:     Cervical: No cervical adenopathy.  Skin:    General: Skin is warm and dry.  Neurological:     General: No focal deficit present.     Mental Status: He is alert and oriented to person, place, and time.  Psychiatric:        Mood and Affect: Mood normal.        Behavior: Behavior normal.      UC Treatments / Results  Labs (all labs ordered are listed, but only abnormal results are displayed) Labs Reviewed  CULTURE, GROUP A STREP [facility])  POCT RAPID STREP A, ED / UC    EKG   Radiology No results found.  Procedures Procedures (including critical care time)  Medications Ordered in UC Medications - No data to display  Initial Impression / Assessment and Plan / UC Course  I have reviewed the triage vital signs and the nursing notes.  Pertinent labs & imaging results that were available during my care of the patient were reviewed by me and considered in my medical decision making (see chart for details).    27 yo male presenting with acute sore throat.  He denies any fevers but does have a low-grade temperature of 99.5 today in the clinic.  Physical exam as above.  Rapid strep negative. Throat cultures pending. Use of OTC analgesics recommended as well as salt water gargles. Follow up as needed.  Today's evaluation has revealed no signs of a dangerous process. Discussed diagnosis with patient and/or guardian. Patient and/or guardian aware of their diagnosis, possible red flag symptoms to watch out for and need for close follow up. Patient and/or guardian understands verbal and written discharge instructions. Patient and/or guardian comfortable with plan and disposition.  Patient and/or guardian has a clear mental status at this time, good insight into illness (after discussion and teaching) and has clear judgment to make decisions regarding their care  Documentation was completed with the aid of voice recognition software. Transcription may contain  typographical errors. Final Clinical Impressions(s) / UC Diagnoses   Final diagnoses:  Viral pharyngitis     Discharge Instructions      Pharyngitis is a sore throat. This is when there is redness, pain, and swelling in your throat. Most of the time, this condition gets better on its own. The test we performed today does not show strep  which is a bacterial infection. Your sore throat is likely due to a virus. Antibiotic medicines are not prescribed for viral infections. This is because antibiotics are designed to kill bacteria. They do not kill viruses.  Try the following to help with your pain:  Sip warm liquids.  Eat or drink cold or frozen liquids Use throat lozenges  Gargle with warm salt water  Take ibuprofen or tylenol        ED Prescriptions   None    PDMP not reviewed this encounter.   Enrique Sack, [place] 02/24/23 2017

## 2023-02-25 ENCOUNTER — Other Ambulatory Visit: Payer: Self-pay

## 2023-02-25 DIAGNOSIS — Z1152 Encounter for screening for COVID-19: Secondary | ICD-10-CM | POA: Insufficient documentation

## 2023-02-25 DIAGNOSIS — J020 Streptococcal pharyngitis: Secondary | ICD-10-CM | POA: Insufficient documentation

## 2023-02-25 DIAGNOSIS — Z7951 Long term (current) use of inhaled steroids: Secondary | ICD-10-CM | POA: Insufficient documentation

## 2023-02-25 DIAGNOSIS — J45909 Unspecified asthma, uncomplicated: Secondary | ICD-10-CM | POA: Insufficient documentation

## 2023-02-25 LAB — CULTURE, GROUP A STREP (THRC)

## 2023-02-25 LAB — GROUP A STREP BY PCR: Group A Strep by PCR: DETECTED — AB

## 2023-02-25 MED ORDER — IBUPROFEN 800 MG PO TABS
800.0000 mg | ORAL_TABLET | Freq: Once | ORAL | Status: AC
  Administered 2023-02-25: 800 mg via ORAL
  Filled 2023-02-25: qty 1

## 2023-02-25 NOTE — ED Triage Notes (Signed)
Pt reports pain to right side throat for the last two days. Pt reports being swabbed for strep at [facility] and it was negative. Pt tonsils are red and swollen. Pt denies other symptoms.

## 2023-02-26 ENCOUNTER — Emergency Department (HOSPITAL_BASED_OUTPATIENT_CLINIC_OR_DEPARTMENT_OTHER)
Admission: EM | Admit: 2023-02-26 | Discharge: 2023-02-26 | Disposition: A | Discharge status: Home / Self Care | Payer: Self-pay | Attending: Emergency Medicine | Admitting: Emergency Medicine

## 2023-02-26 DIAGNOSIS — J020 Streptococcal pharyngitis: Secondary | ICD-10-CM

## 2023-02-26 LAB — RESP PANEL BY RT-PCR (RSV, FLU A&B, COVID)  RVPGX2
Influenza A by PCR: NEGATIVE
Influenza B by PCR: NEGATIVE
Resp Syncytial Virus by PCR: NEGATIVE
SARS Coronavirus 2 by RT PCR: NEGATIVE

## 2023-02-26 LAB — CULTURE, GROUP A STREP (THRC)

## 2023-02-26 MED ORDER — DEXAMETHASONE 4 MG PO TABS
8.0000 mg | ORAL_TABLET | Freq: Once | ORAL | Status: AC
  Administered 2023-02-26: 8 mg via ORAL
  Filled 2023-02-26: qty 2

## 2023-02-26 MED ORDER — AMOXICILLIN 500 MG PO CAPS: 500.0000 mg | ORAL_CAPSULE | Freq: Three times a day (TID) | ORAL | 0 refills | Status: DC

## 2023-02-26 MED ORDER — AMOXICILLIN 500 MG PO CAPS
500.0000 mg | ORAL_CAPSULE | Freq: Once | ORAL | Status: AC
  Administered 2023-02-26: 500 mg via ORAL
  Filled 2023-02-26: qty 1

## 2023-02-26 MED ORDER — AMOXICILLIN 250 MG/5ML PO SUSR: 500.0000 mg | Freq: Three times a day (TID) | ORAL | 0 refills | Status: DC

## 2023-02-26 NOTE — ED Provider Notes (Signed)
Ralph Sheppard Provider Note   CSN: [id number] Arrival date & time: 02/25/23  2254     History  Chief Complaint  Patient presents with   Sore Throat    Ralph Sheppard is a 27 y.o. male.  The history is provided by the patient and a parent.  Patient with worsening sore throat over the past several days.  He reports it hurts to swallow.  He has had fevers.  No vomiting.  He had a recent strep test that was negative.  However symptoms are worsening.  No active coughing.  No other acute complaints    Past Medical History:  Diagnosis Date   ADHD    Asthma     Home Medications Prior to Admission medications   Medication Sig Start Date End Date Taking? Authorizing Provider  amoxicillin (AMOXIL) 250 MG/5ML suspension Take 10 mLs (500 mg total) by mouth 3 (three) times daily. 02/26/23  Yes Ripley Fraise, MD  amoxicillin (AMOXIL) 500 MG capsule Take 1 capsule (500 mg total) by mouth 3 (three) times daily. 02/26/23  Yes Ripley Fraise, MD  ALBUTEROL IN Inhale into the lungs.    [provider]  cetirizine (ZYRTEC) 10 MG tablet Take 10 mg by mouth daily.    [provider]      Allergies    Patient has no known allergies.    Review of Systems   Review of Systems  Physical Exam Updated Vital Signs BP (!) 135/97 (BP Location: Right Arm)   Pulse 89   Temp 98.7 F (37.1 C) (Oral)   Resp 18   Ht 1.753 m ('5\' 9"'$ )   Wt 67.6 kg   SpO2 100%   BMI 22.00 kg/m  Physical Exam CONSTITUTIONAL: Well developed/well nourished, no distress, smiling HEAD: Normocephalic/atraumatic EYES: EOMI/PERRL ENMT: Mucous membranes moist, uvula midline but tonsils are enlarged with exudates.  No stridor, no drooling.  No dysphonia NECK: supple no meningeal signs, no anterior cervical lymphadenopathy NEURO: Pt is awake/alert/appropriate, moves all extremitiesx4.  No facial droop.   EXTREMITIES: full ROM SKIN: warm, color normal PSYCH: no  abnormalities of mood noted, alert and oriented to situation  ED Results / Procedures / Treatments   Labs (all labs ordered are listed, but only abnormal results are displayed) Labs Reviewed  GROUP A STREP BY PCR - Abnormal; Notable for the following components:      Result Value   Group A Strep by PCR DETECTED (*)    All other components within normal limits  RESP PANEL BY RT-PCR (RSV, FLU A&B, COVID)  [id number]    EKG None  Radiology No results found.  Procedures Procedures    Medications Ordered in ED Medications  ibuprofen (ADVIL) tablet 800 mg (800 mg Oral Given 02/25/23 2326)  dexamethasone (DECADRON) tablet 8 mg (8 mg Oral Given 02/26/23 0056)  amoxicillin (AMOXIL) capsule 500 mg (500 mg Oral Given 02/26/23 0056)    ED Course/ Medical Decision Making/ A&P                             Medical Decision Making Risk Prescription drug management.   Now found to have strep pharyngitis.  He is in no distress.  He is tolerating fluids.  Will discharge home.  After I sent the prescription for amoxicillin tablets, mother request that he have the liquid formulation        Final Clinical Impression(s) / ED Diagnoses  Final diagnoses:  Strep pharyngitis    Rx / DC Orders ED Discharge Orders          Ordered    amoxicillin (AMOXIL) 500 MG capsule  3 times daily        02/26/23 0049    amoxicillin (AMOXIL) 250 MG/5ML suspension  3 times daily        02/26/23 0052              Ripley Fraise, MD 02/26/23 0221

## 2023-08-05 ENCOUNTER — Other Ambulatory Visit: Payer: Self-pay

## 2023-08-05 ENCOUNTER — Encounter (HOSPITAL_COMMUNITY): Payer: Self-pay | Admitting: Emergency Medicine

## 2023-08-05 ENCOUNTER — Emergency Department (HOSPITAL_COMMUNITY)
Admission: EM | Admit: 2023-08-05 | Discharge: 2023-08-07 | Disposition: A | Discharge status: Other Acute Inpatient Hospital | Payer: No Typology Code available for payment source | Attending: Emergency Medicine | Admitting: Emergency Medicine

## 2023-08-05 DIAGNOSIS — F32A Depression, unspecified: Secondary | ICD-10-CM | POA: Diagnosis present

## 2023-08-05 DIAGNOSIS — E876 Hypokalemia: Secondary | ICD-10-CM | POA: Insufficient documentation

## 2023-08-05 DIAGNOSIS — R45851 Suicidal ideations: Secondary | ICD-10-CM | POA: Diagnosis not present

## 2023-08-05 DIAGNOSIS — F329 Major depressive disorder, single episode, unspecified: Secondary | ICD-10-CM | POA: Diagnosis not present

## 2023-08-05 NOTE — ED Triage Notes (Addendum)
BIBA Per EMS: Pt was found on a bridge ready to jump off when officers stopped him. Pt denies SI/HI at this current time but was going to carry out plan earlier. Hx same  Per pt: pt reports he was just walking on the bridge when officers thought he was going to jump. Pt denies SI/HI or plan to jump.

## 2023-08-06 DIAGNOSIS — F329 Major depressive disorder, single episode, unspecified: Secondary | ICD-10-CM

## 2023-08-06 DIAGNOSIS — F32A Depression, unspecified: Secondary | ICD-10-CM | POA: Diagnosis present

## 2023-08-06 LAB — CBC WITH DIFFERENTIAL/PLATELET
Abs Immature Granulocytes: 0.01 10*3/uL (ref 0.00–0.07)
Basophils Absolute: 0 10*3/uL (ref 0.0–0.1)
Basophils Relative: 1 %
Eosinophils Absolute: 0.2 10*3/uL (ref 0.0–0.5)
Eosinophils Relative: 4 %
HCT: 44.3 % (ref 39.0–52.0)
Hemoglobin: 13.1 g/dL (ref 13.0–17.0)
Immature Granulocytes: 0 %
Lymphocytes Relative: 54 %
Lymphs Abs: 2.2 10*3/uL (ref 0.7–4.0)
MCH: 22.7 pg — ABNORMAL LOW (ref 26.0–34.0)
MCHC: 29.6 g/dL — ABNORMAL LOW (ref 30.0–36.0)
MCV: 76.8 fL — ABNORMAL LOW (ref 80.0–100.0)
Monocytes Absolute: 0.5 10*3/uL (ref 0.1–1.0)
Monocytes Relative: 13 %
Neutro Abs: 1.1 10*3/uL — ABNORMAL LOW (ref 1.7–7.7)
Neutrophils Relative %: 28 %
Platelets: 218 10*3/uL (ref 150–400)
RBC: 5.77 MIL/uL (ref 4.22–5.81)
RDW: 13 % (ref 11.5–15.5)
WBC: 4 10*3/uL (ref 4.0–10.5)
nRBC: 0 % (ref 0.0–0.2)

## 2023-08-06 LAB — RAPID URINE DRUG SCREEN, HOSP PERFORMED
Amphetamines: NOT DETECTED
Barbiturates: NOT DETECTED
Benzodiazepines: NOT DETECTED
Cocaine: NOT DETECTED
Opiates: NOT DETECTED
Tetrahydrocannabinol: NOT DETECTED

## 2023-08-06 LAB — COMPREHENSIVE METABOLIC PANEL
ALT: 17 U/L (ref 0–44)
AST: 19 U/L (ref 15–41)
Albumin: 4.2 g/dL (ref 3.5–5.0)
Alkaline Phosphatase: 45 U/L (ref 38–126)
Anion gap: 7 (ref 5–15)
BUN: 15 mg/dL (ref 6–20)
CO2: 28 mmol/L (ref 22–32)
Calcium: 8.8 mg/dL — ABNORMAL LOW (ref 8.9–10.3)
Chloride: 102 mmol/L (ref 98–111)
Creatinine, Ser: 0.89 mg/dL (ref 0.61–1.24)
GFR, Estimated: >60 mL/min (ref >60)
Glucose, Bld: 84 mg/dL (ref 70–99)
Potassium: 3.2 mmol/L — ABNORMAL LOW (ref 3.5–5.1)
Sodium: 137 mmol/L (ref 135–145)
Total Bilirubin: 0.3 mg/dL (ref 0.3–1.2)
Total Protein: 7.5 g/dL (ref 6.5–8.1)

## 2023-08-06 LAB — ETHANOL: Alcohol, Ethyl (B): <10 mg/dL (ref <10)

## 2023-08-06 MED ORDER — ALUM & MAG HYDROXIDE-SIMETH 200-200-20 MG/5ML PO SUSP: 30.0000 mL | Freq: Four times a day (QID) | ORAL | Status: DC | PRN

## 2023-08-06 MED ORDER — POTASSIUM CHLORIDE CRYS ER 20 MEQ PO TBCR
40.0000 meq | EXTENDED_RELEASE_TABLET | Freq: Once | ORAL | Status: AC
  Administered 2023-08-06: 40 meq via ORAL
  Filled 2023-08-06: qty 2

## 2023-08-06 MED ORDER — ACETAMINOPHEN 325 MG PO TABS: 650.0000 mg | ORAL_TABLET | ORAL | Status: DC | PRN

## 2023-08-06 MED ORDER — FLUOXETINE HCL 10 MG PO CAPS
10.0000 mg | ORAL_CAPSULE | Freq: Every day | ORAL | Status: DC
  Administered 2023-08-07: 10 mg via ORAL
  Filled 2023-08-06: qty 1

## 2023-08-06 MED ORDER — HYDROXYZINE HCL 25 MG PO TABS: 25.0000 mg | ORAL_TABLET | Freq: Three times a day (TID) | ORAL | Status: DC | PRN

## 2023-08-06 MED ORDER — ONDANSETRON HCL 4 MG PO TABS: 4.0000 mg | ORAL_TABLET | Freq: Three times a day (TID) | ORAL | Status: DC | PRN

## 2023-08-06 MED ORDER — POTASSIUM CHLORIDE CRYS ER 20 MEQ PO TBCR
40.0000 meq | EXTENDED_RELEASE_TABLET | ORAL | Status: AC
  Administered 2023-08-06 (×3): 40 meq via ORAL
  Filled 2023-08-06: qty 2
  Filled 2023-08-06: qty 4
  Filled 2023-08-06: qty 2
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
## 2023-08-06 NOTE — ED Notes (Signed)
Gave report to Alfonzo Feller RN

## 2023-08-06 NOTE — ED Provider Notes (Signed)
  Physical Exam  BP 126/89 (BP Location: Left Arm)   Pulse 69   Temp 97.7 F (36.5 C) (Oral)   Resp 18   SpO2 100%   Physical Exam  Procedures  Procedures  ED Course / MDM    Medical Decision Making Amount and/or Complexity of Data Reviewed Labs: ordered.  Risk OTC drugs. Prescription drug management.   [facility].       Benjiman Core, MD 08/06/23 1130

## 2023-08-06 NOTE — BH Assessment (Signed)
Per Abby, RN: "Pt is in a private room, he is able to do TTS, has not been fully medically cleared yet due to not all of his labs resulting yet and voluntary." Clinician asked RN to let her know when the pt is medically cleared and able to engage.    Redmond Pulling, MS, [facility], [facility] Triage Specialist [phone]

## 2023-08-06 NOTE — BH Assessment (Signed)
Clinician messaged [name], RN: "Hey. It's Trey with TTS. Is the pt able to engage in the assessment, if so the pt will need to be placed in a private room. Is the pt under IVC? Also is the pt medically cleared?   Clinician awaiting response.  Redmond Pulling, MS, [facility], [facility] Triage Specialist [phone]

## 2023-08-06 NOTE — ED Notes (Signed)
Pt has 2 belonging bags- 1 bag with shoes, 1 bag with clothing and a cell phone. Pt has a bookbag as well. All located in 23-25 cabinets.

## 2023-08-06 NOTE — ED Provider Notes (Signed)
[place] EMERGENCY DEPARTMENT AT [facility] Provider Note   CSN: [id number] Arrival date & time: 08/05/23  2349     History  Chief Complaint  Patient presents with   Suicidal    Ralph Sheppard is a 27 y.o. male.  The history is provided by the patient and the EMS personnel.  He has history of depression and anxiety and was brought in by EMS after being found walking on a ledge of a bridge.  Patient tells me that he is very fanatical about gymnastics and that he was not intending to jump.  However, he does admit to significant depression and states that he is in a dark place.  He admits to "crying on the inside" and anhedonia but he denies early morning wakening.  He denies hallucinations.  He states that he has had suicidal ideation in the remote past (running in front of a car) but denies suicidal ideation currently.  He is not under the care of a psychiatrist or therapist.  He denies ethanol and drug use.   Home Medications Prior to Admission medications   Medication Sig Start Date End Date Taking? Authorizing Provider  ALBUTEROL IN Inhale into the lungs.    [provider]  cetirizine (ZYRTEC) 10 MG tablet Take 10 mg by mouth daily.    [provider]      Allergies    Patient has no known allergies.    Review of Systems   Review of Systems  All other systems reviewed and are negative.   Physical Exam Updated Vital Signs BP 129/78   Pulse 64   Resp 18   SpO2 99%  Physical Exam Vitals and nursing note reviewed.   27 year old male, resting comfortably and in no acute distress. Vital signs are normal. Oxygen saturation is 99%, which is normal. Head is normocephalic and atraumatic. PERRLA, EOMI. Oropharynx is clear. Neck is nontender and supple without adenopathy or JVD. Back is nontender and there is no CVA tenderness. Lungs are clear without rales, wheezes, or rhonchi. Chest is nontender. Heart has regular rate and rhythm without  murmur. Abdomen is soft, flat, nontender. Extremities have no cyanosis or edema, full range of motion is present. Skin is warm and dry without rash. Neurologic: Mental status is normal, cranial nerves are intact, moves all extremities equally. Psychiatric: Normal affect.  Makes good eye contact, speaks with normal inflections, does not appear overtly depressed.  ED Results / Procedures / Treatments   Labs (all labs ordered are listed, but only abnormal results are displayed) Labs Reviewed  COMPREHENSIVE METABOLIC PANEL  ETHANOL  CBC WITH DIFFERENTIAL/PLATELET   Procedures Procedures    Medications Ordered in ED Medications  acetaminophen (TYLENOL) tablet 650 mg (has no administration in time range)  ondansetron (ZOFRAN) tablet 4 mg (has no administration in time range)  alum & mag hydroxide-simeth (MAALOX/MYLANTA) 200-200-20 MG/5ML suspension 30 mL (has no administration in time range)    ED Course/ Medical Decision Making/ A&P                                 Medical Decision Making Amount and/or Complexity of Data Reviewed Labs: ordered.  Risk OTC drugs. Prescription drug management.   Patient to was walking on a ledge of a bridge but denying suicidal ideation.  I have reviewed his past records, and see no prior psychiatric visits.  However, given that time of day  and the danger involved in the activity, I feel it is appropriate to have TTS evaluation to be sure that he is not actually suicidal.  I have ordered screening labs of CBC, comprehensive metabolic panel, ethanol level.  I have reviewed and interpreted his laboratory tests, and my interpretation is mild hypokalemia and I have ordered oral potassium, undetectable ethanol, normal CBC.  TTS consultation is appreciated, they are recommending inpatient psychiatric care.  Final Clinical Impression(s) / ED Diagnoses Final diagnoses:  Suicidal ideation  Hypokalemia    Rx / DC Orders ED Discharge Orders     None          [name], MD 08/06/23 [id number]
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
## 2023-08-07 ENCOUNTER — Inpatient Hospital Stay (HOSPITAL_COMMUNITY)
Admission: AD | Admit: 2023-08-07 | Discharge: 2023-08-12 | DRG: 885 | Disposition: A | Discharge status: Home / Self Care | Payer: No Typology Code available for payment source | Source: Intra-hospital | Attending: Psychiatry | Admitting: Psychiatry

## 2023-08-07 ENCOUNTER — Other Ambulatory Visit: Payer: Self-pay

## 2023-08-07 ENCOUNTER — Encounter (HOSPITAL_COMMUNITY): Payer: Self-pay | Admitting: Nurse Practitioner

## 2023-08-07 DIAGNOSIS — F4010 Social phobia, unspecified: Secondary | ICD-10-CM | POA: Diagnosis present

## 2023-08-07 DIAGNOSIS — Z833 Family history of diabetes mellitus: Secondary | ICD-10-CM | POA: Diagnosis not present

## 2023-08-07 DIAGNOSIS — J45909 Unspecified asthma, uncomplicated: Secondary | ICD-10-CM | POA: Diagnosis present

## 2023-08-07 DIAGNOSIS — Z8249 Family history of ischemic heart disease and other diseases of the circulatory system: Secondary | ICD-10-CM

## 2023-08-07 DIAGNOSIS — Z5900 Homelessness unspecified: Secondary | ICD-10-CM

## 2023-08-07 DIAGNOSIS — Z79899 Other long term (current) drug therapy: Secondary | ICD-10-CM

## 2023-08-07 DIAGNOSIS — Z9151 Personal history of suicidal behavior: Secondary | ICD-10-CM | POA: Diagnosis not present

## 2023-08-07 DIAGNOSIS — Z5941 Food insecurity: Secondary | ICD-10-CM | POA: Diagnosis not present

## 2023-08-07 DIAGNOSIS — F322 Major depressive disorder, single episode, severe without psychotic features: Principal | ICD-10-CM | POA: Diagnosis present

## 2023-08-07 DIAGNOSIS — R45851 Suicidal ideations: Secondary | ICD-10-CM | POA: Diagnosis present

## 2023-08-07 DIAGNOSIS — Z825 Family history of asthma and other chronic lower respiratory diseases: Secondary | ICD-10-CM

## 2023-08-07 DIAGNOSIS — F329 Major depressive disorder, single episode, unspecified: Secondary | ICD-10-CM | POA: Diagnosis not present

## 2023-08-07 MED ORDER — ACETAMINOPHEN 325 MG PO TABS: 650.0000 mg | ORAL_TABLET | Freq: Four times a day (QID) | ORAL | Status: DC | PRN

## 2023-08-07 MED ORDER — TRAZODONE HCL 50 MG PO TABS
50.0000 mg | ORAL_TABLET | Freq: Every evening | ORAL | Status: DC | PRN
  Administered 2023-08-08 – 2023-08-11 (×4): 50 mg via ORAL
  Filled 2023-08-07 (×5): qty 1

## 2023-08-07 MED ORDER — HALOPERIDOL 5 MG PO TABS: 5.0000 mg | ORAL_TABLET | Freq: Three times a day (TID) | ORAL | Status: DC | PRN

## 2023-08-07 MED ORDER — ALUM & MAG HYDROXIDE-SIMETH 200-200-20 MG/5ML PO SUSP: 30.0000 mL | ORAL | Status: DC | PRN

## 2023-08-07 MED ORDER — MAGNESIUM HYDROXIDE 400 MG/5ML PO SUSP: 30.0000 mL | Freq: Every day | ORAL | Status: DC | PRN

## 2023-08-07 MED ORDER — LORAZEPAM 1 MG PO TABS: 2.0000 mg | ORAL_TABLET | Freq: Three times a day (TID) | ORAL | Status: DC | PRN

## 2023-08-07 MED ORDER — HALOPERIDOL LACTATE 5 MG/ML IJ SOLN: 5.0000 mg | Freq: Three times a day (TID) | INTRAMUSCULAR | Status: DC | PRN

## 2023-08-07 MED ORDER — LORAZEPAM 2 MG/ML IJ SOLN: 2.0000 mg | Freq: Three times a day (TID) | INTRAMUSCULAR | Status: DC | PRN

## 2023-08-07 MED ORDER — HYDROXYZINE HCL 25 MG PO TABS
25.0000 mg | ORAL_TABLET | Freq: Three times a day (TID) | ORAL | Status: DC | PRN
  Administered 2023-08-08 – 2023-08-11 (×5): 25 mg via ORAL
  Filled 2023-08-07 (×5): qty 1

## 2023-08-07 NOTE — Progress Notes (Signed)
Pt signed 72 hour document at 1700.

## 2023-08-07 NOTE — ED Notes (Signed)
Ralph Sheppard remains asleep with no distress noted.

## 2023-08-07 NOTE — Tx Team (Signed)
Initial Treatment Plan 08/07/2023 6:25 PM Ralph Sheppard NWG:[id number]    PATIENT STRESSORS: Financial difficulties   Marital or family conflict     PATIENT STRENGTHS: Capable of independent living  Physical Health  Supportive family/friends    PATIENT IDENTIFIED PROBLEMS: Lack of finances   Unstable living environment  Family conflicts                 DISCHARGE CRITERIA:  Ability to meet basic life and health needs  PRELIMINARY DISCHARGE PLAN: Return to previous work or school arrangements  PATIENT/FAMILY INVOLVEMENT: This treatment plan has been presented to and reviewed with the patient, Ralph Sheppard, and/or family member. The patient and family have been given the opportunity to ask questions and make suggestions.  Mathews Argyle, RN 08/07/2023, 6:25 PM

## 2023-08-07 NOTE — Progress Notes (Signed)
Pt is A&OX4, calm, denies suicidal ideations, denies homicidal ideations, denies auditory hallucinations and denies visual hallucinations. Pt verbally agrees to approach staff if these become apparent and before harming self or others. Pt denies experiencing nightmares. Mood and affect are congruent. Pt appetite is ok. No complaints of anxiety, distress, pain and/or discomfort at this time. Pt's memory appears to be grossly intact, and Pt hasn't displayed any injurious behaviors. Pt is medication compliant. There's no evidence of suicidal intent. Psychomotor activity was WNL. No s/s of Parkinson, Dystonia, Akathisia and/or Tardive Dyskinesia noted.   

## 2023-08-07 NOTE — Group Note (Signed)
[facility] LCSW Group Therapy Note   Group Date: 08/07/2023 Start Time: 1100 End Time: 1200   Type of Therapy/Topic:  Group Therapy:  Emotion Regulation  Participation Level:  Did Not Attend   Mood:  Description of Group:    The purpose of this group is to assist patients in learning to regulate negative emotions and experience positive emotions. Patients will be guided to discuss ways in which they have been vulnerable to their negative emotions. These vulnerabilities will be juxtaposed with experiences of positive emotions or situations, and patients challenged to use positive emotions to combat negative ones. Special emphasis will be placed on coping with negative emotions in conflict situations, and patients will process healthy conflict resolution skills.  Therapeutic Goals: Patient will identify two positive emotions or experiences to reflect on in order to balance out negative emotions:  Patient will label two or more emotions that they find the most difficult to experience:  Patient will be able to demonstrate positive conflict resolution skills through discussion or role plays:   Summary of Patient Progress:       Therapeutic Modalities:   Cognitive Behavioral Therapy Feelings Identification Dialectical Behavioral Therapy   Starleen Arms, LCSW

## 2023-08-07 NOTE — ED Provider Notes (Signed)
Emergency Medicine Observation Re-evaluation Note  Ralph Sheppard is a 27 y.o. male, seen on rounds today.  Pt initially presented to the ED for complaints of Suicidal Currently, the patient is asleep.  Pt has been assessed by TTS and inpatient psych admission recommended.  No reported problems overnight.  Physical Exam  BP 100/62 (BP Location: Right Arm)   Pulse (!) 58   Temp 98.2 F (36.8 C) (Oral)   Resp 20   SpO2 100%  Physical Exam General: asleep Cardiac: rr Lungs: clear Psych: asleep  ED Course / MDM  EKG:   I have reviewed the labs performed to date as well as medications administered while in observation.  Recent changes in the last 24 hours include [facility] acceptance.  Plan  Current plan is for inpatient psych admission.    Ralph Lefevre, MD 08/07/23 [phone]

## 2023-08-07 NOTE — ED Notes (Signed)
Ralph Sheppard is currently sleeping with no signs of distress or discomfort respiration are easy and skin color is WNL.

## 2023-08-07 NOTE — Progress Notes (Signed)
Pt is under review by [facility] Fransico Michael, RN for inpatient behavioral health placement PENDING signed consent faxed to [facility] [phone] or upload to chart and notify. Pt meets inpatient BH placement per Joaquim Nam.  Care Team notified: Night [facility] AC Herold Harms, Bobby Brooks,RN, [facility], Abby Woody,RN, [place] Stover,LCMHC, Brianne Roque Cash, Paramedic    Maryjean Ka, MSW, [facility] 08/07/2023 1:37 AM

## 2023-08-07 NOTE — Group Note (Signed)
Date:  08/07/2023 Time:  10:02 PM  Group Topic/Focus:  Wrap-Up Group:   The focus of this group is to help patients review their daily goal of treatment and discuss progress on daily workbooks.    Participation Level:  Minimal  Participation Quality:  Appropriate and Sharing  Affect:  Appropriate  Cognitive:  Appropriate  Insight: Appropriate and Limited  Engagement in Group:  Lacking and Limited  Modes of Intervention:  Discussion and Socialization  Additional Comments:  Patient was very limited. Patient was just admitted today. The patient stated that he feels like he is being feel hostage and feels that he spoke to much and that is the reason why he is here.   Ralph Sheppard 08/07/2023, 10:02 PM

## 2023-08-07 NOTE — ED Notes (Signed)
Report called to [facility] 

## 2023-08-07 NOTE — Progress Notes (Signed)
Pt has been accepted to [facility] TODAY 08/07/2023 pending voluntary consent. Bed assignment: 407-2  Pt meets inpatient criteria per Dahlia Byes, NP  Attending Physician will be Phineas Inches, MD  Report can be called to: - Adult unit: [id number]  Pt can arrive after pending item is received  Care Team Notified: [facility] Rona Ravens, RN, Dahlia Byes, NP, Harless Litten, RN, and Gevena Mart, RN  [place], [place]  08/07/2023 10:29 AM
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
## 2023-08-08 ENCOUNTER — Encounter (HOSPITAL_COMMUNITY): Payer: Self-pay

## 2023-08-08 DIAGNOSIS — F322 Major depressive disorder, single episode, severe without psychotic features: Secondary | ICD-10-CM

## 2023-08-08 MED ORDER — POTASSIUM CHLORIDE CRYS ER 20 MEQ PO TBCR
40.0000 meq | EXTENDED_RELEASE_TABLET | Freq: Once | ORAL | Status: AC
  Administered 2023-08-08: 40 meq via ORAL
  Filled 2023-08-08 (×2): qty 2

## 2023-08-08 MED ORDER — SERTRALINE HCL 25 MG PO TABS
25.0000 mg | ORAL_TABLET | Freq: Every day | ORAL | Status: DC
  Administered 2023-08-08 – 2023-08-09 (×2): 25 mg via ORAL
  Filled 2023-08-08 (×3): qty 1

## 2023-08-08 NOTE — Progress Notes (Signed)
Patient rated his depression level 0/10 and his anxiety level 0/10 this AM upon assessment. Pt then  became angry and irritable, stating," I'm ready to e discharge I shouldn't have come in here". Vistaril 25 mg po administered for increasing anxiety with effective results. Pt compliant with  prescribed medication and group therapy. Pt stated his goal for today as " To get out of this place as soon as possible". Observed interacting well with peers. Pt denied SI/HI and AVH. Safety maintained.  08/08/23 0825  Psych Admission Type (Psych Patients Only)  Admission Status Voluntary/72 hour document signed  Psychosocial Assessment  Patient Complaints Anxiety;Anger  Eye Contact Brief  Facial Expression Flat;Angry  Affect Angry;Anxious;Depressed;Irritable  Speech Logical/coherent  Interaction Assertive  Motor Activity Fidgety  Appearance/Hygiene Unremarkable  Behavior Characteristics Cooperative;Anxious;Fidgety;Irritable  Mood Depressed;Anxious;Angry  Thought Process  Coherency WDL  Content Blaming others  Delusions None reported or observed  Perception WDL  Hallucination None reported or observed  Judgment Poor  Confusion None  Danger to Self  Current suicidal ideation? Denies  Self-Injurious Behavior No self-injurious ideation or behavior indicators observed or expressed   Agreement Not to Harm Self Yes  Description of Agreement Verbal  Danger to Others  Danger to Others None reported or observed

## 2023-08-08 NOTE — Progress Notes (Signed)
Patient pleasant, calm and cooperative during shift assessment. Did report feeling some anxiety. PRN Hydroxyzine given. Patient attended group. Denies SI/HI. Denies AVH.
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
## 2023-08-08 NOTE — BH IP Treatment Plan (Signed)
Interdisciplinary Treatment and Diagnostic Plan Update  08/08/2023 Time of Session: 10:40am Ralph Sheppard MRN: [id number]  Principal Diagnosis: MDD (major depressive disorder), severe (HCC)  Secondary Diagnoses: Principal Problem:   MDD (major depressive disorder), severe (HCC)   Current Medications:  Current Facility-Administered Medications  Medication Dose Route Frequency Provider Last Rate Last Admin   acetaminophen (TYLENOL) tablet 650 mg  650 mg Oral Q6H PRN Onuoha, Chinwendu V, NP       alum & mag hydroxide-simeth (MAALOX/MYLANTA) 200-200-20 MG/5ML suspension 30 mL  30 mL Oral Q4H PRN Onuoha, Chinwendu V, NP       haloperidol (HALDOL) tablet 5 mg  5 mg Oral TID PRN Onuoha, Chinwendu V, NP       Or   haloperidol lactate (HALDOL) injection 5 mg  5 mg Intramuscular TID PRN Onuoha, Chinwendu V, NP       hydrOXYzine (ATARAX) tablet 25 mg  25 mg Oral TID PRN Onuoha, Chinwendu V, NP   25 mg at 08/08/23 0823   LORazepam (ATIVAN) tablet 2 mg  2 mg Oral TID PRN Onuoha, Chinwendu V, NP       Or   LORazepam (ATIVAN) injection 2 mg  2 mg Intramuscular TID PRN Onuoha, Chinwendu V, NP       magnesium hydroxide (MILK OF MAGNESIA) suspension 30 mL  30 mL Oral Daily PRN Onuoha, Chinwendu V, NP       traZODone (DESYREL) tablet 50 mg  50 mg Oral QHS PRN Onuoha, Chinwendu V, NP       PTA Medications: No medications prior to admission.    Patient Stressors: Financial difficulties   Marital or family conflict    Patient Strengths: Capable of independent living  Physical Health  Supportive family/friends   Treatment Modalities: Medication Management, Group therapy, Case management,  1 to 1 session with clinician, Psychoeducation, Recreational therapy.   Physician Treatment Plan for Primary Diagnosis: MDD (major depressive disorder), severe (HCC) Long Term Goal(s):     Short Term Goals:    Medication Management: Evaluate patient's response, side effects, and tolerance of medication  regimen.  Therapeutic Interventions: 1 to 1 sessions, Unit Group sessions and Medication administration.  Evaluation of Outcomes: Progressing  Physician Treatment Plan for Secondary Diagnosis: Principal Problem:   MDD (major depressive disorder), severe (HCC)  Long Term Goal(s):     Short Term Goals:       Medication Management: Evaluate patient's response, side effects, and tolerance of medication regimen.  Therapeutic Interventions: 1 to 1 sessions, Unit Group sessions and Medication administration.  Evaluation of Outcomes: Progressing   RN Treatment Plan for Primary Diagnosis: MDD (major depressive disorder), severe (HCC) Long Term Goal(s): Knowledge of disease and therapeutic regimen to maintain health will improve  Short Term Goals: Ability to remain free from injury will improve, Ability to verbalize frustration and anger appropriately will improve, Ability to participate in decision making will improve, Ability to verbalize feelings will improve, Ability to identify and develop effective coping behaviors will improve, and Compliance with prescribed medications will improve  Medication Management: RN will administer medications as ordered by provider, will assess and evaluate patient's response and provide education to patient for prescribed medication. RN will report any adverse and/or side effects to prescribing provider.  Therapeutic Interventions: 1 on 1 counseling sessions, Psychoeducation, Medication administration, Evaluate responses to treatment, Monitor vital signs and CBGs as ordered, Perform/monitor CIWA, COWS, AIMS and Fall Risk screenings as ordered, Perform wound care treatments as ordered.  Evaluation of Outcomes:  Progressing   LCSW Treatment Plan for Primary Diagnosis: MDD (major depressive disorder), severe (HCC) Long Term Goal(s): Safe transition to appropriate next level of care at discharge, Engage patient in therapeutic group addressing interpersonal  concerns.  Short Term Goals: Engage patient in aftercare planning with referrals and resources, Increase social support, Increase emotional regulation, Facilitate acceptance of mental health diagnosis and concerns, Identify triggers associated with mental health/substance abuse issues, and Increase skills for wellness and recovery  Therapeutic Interventions: Assess for all discharge needs, 1 to 1 time with Social worker, Explore available resources and support systems, Assess for adequacy in community support network, Educate family and significant other(s) on suicide prevention, Complete Psychosocial Assessment, Interpersonal group therapy.  Evaluation of Outcomes: Progressing   Progress in Treatment: Attending groups: Yes. Participating in groups: Yes. Taking medication as prescribed: Yes. Toleration medication: Yes. Family/Significant other contact made: No, will contact:  when Pt gives consent Patient understands diagnosis: Yes. Discussing patient identified problems/goals with staff: Yes. Medical problems stabilized or resolved: Yes. Denies suicidal/homicidal ideation: Yes. Issues/concerns per patient self-inventory: No.  New problem(s) identified: No, Describe:  none reported  New Short Term/Long Term Goal(s):medication stabilization, elimination of SI thoughts, development of comprehensive mental wellness plan.    Patient Goals:  "being able to control my emotions"  Discharge Plan or Barriers: Patient recently admitted. CSW will continue to follow and assess for appropriate referrals and possible discharge planning.    Reason for Continuation of Hospitalization: Depression Medication stabilization Suicidal ideation  Estimated Length of Stay: 5-7 days  Last 3 [place] Suicide Severity Risk Score: Flowsheet Row [facility] (Current[facility] from 08/05/2023 in [facility] Emergency Department at [facility]  from 02/26/2023 in [facility] Emergency Department at [facility]  C-SSRS RISK CATEGORY Moderate Risk High Risk No Risk       Last PHQ 2/9 Scores:     No data to display          Scribe for Treatment Team: Izell Seneca, LCSW 08/08/2023 1:54 PM
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
## 2023-08-08 NOTE — Progress Notes (Signed)
   08/08/23 2252  Psych Admission Type (Psych Patients Only)  Admission Status Voluntary/72 hour document signed  Psychosocial Assessment  Patient Complaints Anxiety  Eye Contact Fair  Facial Expression Animated  Affect Anxious  Speech Logical/coherent  Interaction Childlike  Motor Activity Fidgety  Appearance/Hygiene Unremarkable  Behavior Characteristics Cooperative;Appropriate to situation  Mood Anxious;Pleasant  Thought Process  Coherency WDL  Content WDL  Delusions None reported or observed  Perception WDL  Hallucination None reported or observed  Judgment Poor  Confusion None  Danger to Self  Current suicidal ideation? Denies  Self-Injurious Behavior No self-injurious ideation or behavior indicators observed or expressed   Agreement Not to Harm Self Yes  Description of Agreement verbal  Danger to Others  Danger to Others None reported or observed

## 2023-08-08 NOTE — BHH Group Notes (Signed)
Adult Psychoeducational Group Note  Date:  08/08/2023 Time:  10:22 PM  Group Topic/Focus:  Wrap-Up Group:   The focus of this group is to help patients review their daily goal of treatment and discuss progress on daily workbooks.  Participation Level:  Did Not Attend  Christ Kick 08/08/2023, 10:22 PM

## 2023-08-08 NOTE — Group Note (Signed)
Date:  08/08/2023 Time:  3:02 PM  Group Topic/Focus:  Goals Group:   The focus of this group is to help patients establish daily goals to achieve during treatment and discuss how the patient can incorporate goal setting into their daily lives to aide in recovery.    Participation Level:  Did Not Attend   Affect:      Cognitive:      Insight: None  Engagement in Group:     Modes of Intervention:      Additional Comments:     Reymundo Poll 08/08/2023, 3:02 PM

## 2023-08-08 NOTE — Plan of Care (Signed)
  Problem: Activity: Goal: Interest or engagement in activities will improve Outcome: Progressing   Problem: Safety: Goal: Periods of time without injury will increase Outcome: Progressing   Problem: Self-Concept: Goal: Will verbalize positive feelings about self Outcome: Progressing    Problem: Coping: Goal: Coping ability will improve Outcome: Progressing   Problem: Physical Regulation: Goal: Ability to maintain clinical measurements within normal limits will improve Outcome: Progressing

## 2023-08-08 NOTE — H&P (Cosign Needed Addendum)
Psychiatric Admission Assessment Adult  Patient Identification: Ralph Sheppard MRN:  [id number] Date of Evaluation:  08/08/2023 Chief Complaint:  MDD (major depressive disorder), severe (HCC) [F32.2] Principal Diagnosis: MDD (major depressive disorder), severe (HCC) Diagnosis:  Principal Problem:   MDD (major depressive disorder), severe (HCC)  CC: " The EMS took me to the hospital because they think I was trying to jump from the bridge."  History of Present Illness: Ralph Sheppard is a 27 year old African-American homeless male with prior psychiatric history significant for depression and anxiety who presents voluntarily to [facility] from [facility] ED department for being brought by EMS for walking on a ledge of a bridge.  This is this patient's first inpatient psychiatric admission.  During this evaluation, patient reports the same information provided at the ED as follows:  "He has history of depression and anxiety and was brought in by EMS after being found walking on a ledge of a bridge.  Patient tells me that he is very fanatical about gymnastics and that he was not intending to jump.  However, he does admit to significant depression and states that he is in a dark place.  He admits to "crying on the inside" and anhedonia but he denies early morning wakening.  He denies hallucinations.  He states that he has had suicidal ideation in the remote past (running in front of a car) but denies suicidal ideation currently.  He is not under the care of a psychiatrist or therapist.  He denies ethanol and drug use."   Assessment: Patient is alert, oriented to person, place, time, and situation.  Mood appears depressed, however, he is pleasant with smiles and participating effectively in answering assessment questions.  Affect is congruent.  Speech is clear, and coherent.  Able to maintain fair eye contact with this provider.  He does not appear to be responding to internal or external stimuli.  Denies  paranoia or delusional thinking.  Denies mania symptoms.  Denies SI, HI or AVH.  He is admitted for mood stabilization, medication management, and safety.  He denies substance use.  UDS is negative for any substance.  Denies link to outpatient resources, medication management or counseling.  BAL is less than 10.  EKG: Normal sinus rhythm, ventricular rate 63, QT/QTc 394/403.  Vital signs reviewed within normal limits.  This assessment and treatment plan shared with the treatment team and discussed with the attending psychiatrist.  Mode of transport to Hospital: Safe transport Current Outpatient (Home) Medication List: None PRN medication prior to evaluation: None  ED course: Patient was seen at [facility]  Labs and EKG were obtained and analyzed, and patient disposition to [facility] for further psychiatric evaluation and treatment Collateral Information: None obtained at this time POA/Legal Guardian: Patient is his own legal guardian  Past Psychiatric Hx: Previous Psych Diagnoses: No previous psychiatric diagnoses Prior inpatient treatment: No previous inpatient treatment for psychiatric problems Current/prior outpatient treatment: No previous outpatient treatment Prior rehab hx: Denies Psychotherapy hx: Denies History of suicide: Denies History of homicide or aggression: Denies Psychiatric medication history: Denies Psychiatric medication compliance history: Has not been prescribed psychotropic meds Neuromodulation history: Denies Current Psychiatrist: Denies Current therapist: Denies  Substance Abuse Hx: Alcohol: Denies Tobacco: Denies Illicit drugs: Denies Rx drug abuse: Denies Rehab hx: Denies  Past Medical History: Medical Diagnoses: History of asthma, speech deficit, developmental delay Home Rx: Denies Prior Hosp: Denies Prior Surgeries/Trauma: Hand surgery at middle school Head trauma, LOC, concussions, seizures: Denies history of seizures Allergies:  No known drug allergy LMP:  Not applicable Contraception: Not applicable PCP: Denies  Family History: Medical: Diabetes, high blood pressure Psych: Deceased cousin diagnosed with bipolar Psych Rx: Yes SA/HA: Not sure Substance use family hx: Brother drinks alcohol and uses illegal drugs  Social History: Childhood (bring, raised, lives now, parents, siblings, schooling, education): High school diploma Abuse: Denies Marital Status: Single Sexual orientation: Male from birth Children: No children Employment: [employer] Peer Group: Denies Housing: Homelessness Finances: Water quality scientist: The denies Military: Denies  Associated Signs/Symptoms: Depression Symptoms:  depressed mood, anhedonia, fatigue, feelings of worthlessness/guilt, difficulty concentrating, hopelessness, anxiety, loss of energy/fatigue, disturbed sleep, weight loss, decreased labido, decreased appetite,  (Hypo) Manic Symptoms:  Impulsivity,  Anxiety Symptoms:  Excessive Worry, Social Anxiety,  Psychotic Symptoms:   Not applicable  PTSD Symptoms: NA  Total Time spent with patient: 45 minutes  Is the patient at risk to self? Yes.    Has the patient been a risk to self in the past 6 months? No.  Has the patient been a risk to self within the distant past? No.  Is the patient a risk to others? No.  Has the patient been a risk to others in the past 6 months? No.  Has the patient been a risk to others within the distant past? No.   [place] Scale:  Flowsheet Row [facility] ([facility]) [facility] at [facility] ED from 02/26/2023 in [facility] at [facility]  C-SSRS RISK CATEGORY Moderate Risk High Risk No Risk      Alcohol Screening: 1. How often do you have a drink containing alcohol?: Never 2. How many drinks containing alcohol do you have on a typical day when you  are drinking?: 1 or 2 3. How often do you have six or more drinks on one occasion?: Never AUDIT-C Score: 0 4. How often during the last year have you found that you were not able to stop drinking once you had started?: Never 5. How often during the last year have you failed to do what was normally expected from you because of drinking?: Never 6. How often during the last year have you needed a first drink in the morning to get yourself going after a heavy drinking session?: Never 7. How often during the last year have you had a feeling of guilt of remorse after drinking?: Never 8. How often during the last year have you been unable to remember what happened the night before because you had been drinking?: Never 9. Have you or someone else been injured as a result of your drinking?: No 10. Has a relative or friend or a doctor or another health worker been concerned about your drinking or suggested you cut down?: No Alcohol Use Disorder Identification Test Final Score (AUDIT): 0 Alcohol Brief Interventions/Follow-up: Alcohol education/Brief advice  Substance Abuse History in the last 12 months:  No.  Consequences of Substance Abuse: NA Previous Psychotropic Medications: No   Psychological Evaluations: No  Past Medical History:  Past Medical History:  Diagnosis Date   ADHD    Asthma     Past Surgical History:  Procedure Laterality Date   WISDOM TOOTH EXTRACTION     Family History:  Family History  Problem Relation Age of Onset   Asthma Mother    Diabetes Mother    Diabetes Father    Hypertension Father  Tobacco Screening:  Social History   Tobacco Use  Smoking Status Never  Smokeless Tobacco Never    BH Tobacco Counseling     Are you interested in Tobacco Cessation Medications?  No value filed. Counseled patient on smoking cessation:  No value filed. Reason Tobacco Screening Not Completed: No value filed.   Social History:  Social History   Substance and Sexual  Activity  Alcohol Use Never     Social History   Substance and Sexual Activity  Drug Use Never    Additional Social History: Marital status: Single Are you sexually active?: No What is your sexual orientation?: "Straight" Has your sexual activity been affected by drugs, alcohol, medication, or emotional stress?: "No" Does patient have children?: No    Allergies:  No Known Allergies Lab Results: No results found for this or any previous visit (from the past 48 hour(s)).  Blood Alcohol level:  Lab Results  Component Value Date   ETH <10 08/06/2023   Metabolic Disorder Labs:  No results found for: "HGBA1C", "MPG" No results found for: "PROLACTIN" No results found for: "CHOL", "TRIG", "HDL", "CHOLHDL", "VLDL", "LDLCALC"  Current Medications: Current Facility-Administered Medications  Medication Dose Route Frequency Provider Last Rate Last Admin   acetaminophen (TYLENOL) tablet 650 mg  650 mg Oral Q6H PRN Onuoha, Chinwendu V, NP       alum & mag hydroxide-simeth (MAALOX/MYLANTA) 200-200-20 MG/5ML suspension 30 mL  30 mL Oral Q4H PRN Onuoha, Chinwendu V, NP       haloperidol (HALDOL) tablet 5 mg  5 mg Oral TID PRN Onuoha, Chinwendu V, NP       Or   haloperidol lactate (HALDOL) injection 5 mg  5 mg Intramuscular TID PRN Onuoha, Chinwendu V, NP       hydrOXYzine (ATARAX) tablet 25 mg  25 mg Oral TID PRN Onuoha, Chinwendu V, NP   25 mg at 08/08/23 0823   LORazepam (ATIVAN) tablet 2 mg  2 mg Oral TID PRN Onuoha, Chinwendu V, NP       Or   LORazepam (ATIVAN) injection 2 mg  2 mg Intramuscular TID PRN Onuoha, Chinwendu V, NP       magnesium hydroxide (MILK OF MAGNESIA) suspension 30 mL  30 mL Oral Daily PRN Onuoha, Chinwendu V, NP       sertraline (ZOLOFT) tablet 25 mg  25 mg Oral Daily Quinterius Gaida C, FNP   25 mg at 08/08/23 1605   traZODone (DESYREL) tablet 50 mg  50 mg Oral QHS PRN Onuoha, Chinwendu V, NP       PTA Medications: No medications prior to admission.    Musculoskeletal: Strength & Muscle Tone: within normal limits Gait & Station: normal Patient leans: N/A  Psychiatric Specialty Exam:  Presentation  General Appearance:  Appropriate for Environment; Casual  Eye Contact: Good  Speech: Clear and Coherent  Speech Volume: Normal  Handedness: Right  Mood and Affect  Mood: Depressed  Affect: Congruent  Thought Process  Thought Processes: Coherent; Goal Directed  Duration of Psychotic Symptoms:N/A Past Diagnosis of Schizophrenia or Psychoactive disorder: No  Descriptions of Associations:Intact  Orientation:Full (Time, Place and Person)  Thought Content:WDL  Hallucinations:Hallucinations: None  Ideas of Reference:None  Suicidal Thoughts:Suicidal Thoughts: No  Homicidal Thoughts:Homicidal Thoughts: No  Sensorium  Memory: Immediate Good; Recent Good  Judgment: Fair  Insight: Shallow  Executive Functions  Concentration: Good  Attention Span: Good  Recall: Good  Fund of Knowledge: Fair  Language: Good  Psychomotor Activity  Psychomotor  Activity: Psychomotor Activity: Normal  Assets  Assets: Communication Skills; Desire for Improvement; Physical Health; Resilience; Social Support  Sleep  Sleep: Sleep: Good Number of Hours of Sleep: 8  Physical Exam: Physical Exam Vitals and nursing note reviewed.  Constitutional:      Appearance: Normal appearance.  HENT:     Head: Normocephalic.     Nose: Nose normal.     Mouth/Throat:     Mouth: Mucous membranes are moist.  Eyes:     Pupils: Pupils are equal, round, and reactive to light.  Cardiovascular:     Rate and Rhythm: Normal rate.     Pulses: Normal pulses.  Pulmonary:     Effort: Pulmonary effort is normal.  Abdominal:     Comments: Deferred  Genitourinary:    Comments: Deferred Musculoskeletal:        General: Normal range of motion.     Cervical back: Normal range of motion.  Skin:    General: Skin is warm.   Neurological:     General: No focal deficit present.     Mental Status: He is alert and oriented to person, place, and time.  Psychiatric:        Mood and Affect: Mood normal.        Behavior: Behavior normal.    Review of Systems  Constitutional:  Negative for chills and fever.  HENT:  Negative for sore throat.   Eyes:  Negative for blurred vision.  Respiratory:  Negative for cough, shortness of breath and wheezing.   Cardiovascular:  Negative for chest pain and palpitations.  Gastrointestinal:  Negative for abdominal pain, heartburn, nausea and vomiting.  Genitourinary: Negative.   Musculoskeletal: Negative.   Skin:  Negative for itching and rash.  Neurological:  Negative for dizziness, tingling and headaches.  Endo/Heme/Allergies:        See allergy listing  Psychiatric/Behavioral:  Positive for depression. The patient is nervous/anxious and has insomnia.    Blood pressure 110/78, pulse 78, temperature 98.1 F (36.7 C), temperature source Oral, resp. rate 16, height 5\' 9"  (1.753 m), weight 63.9 kg, SpO2 99%. Body mass index is 20.79 kg/m.  Treatment Plan Summary: Daily contact with patient to assess and evaluate symptoms and progress in treatment and Medication management  Physician Treatment Plan for Primary Diagnosis:  Assessment: MDD (major depressive disorder), severe (HCC)  Plan: Medication: -Initiate sertraline tablet 25 mg p.o. daily for depression -Continue trazodone tablet 50 mg p.o. nightly as needed for insomnia -Continue hydroxyzine tablets 25 mg p.o. 3 times daily as needed for anxiety  Agitation protocol: Benadryl capsule 50 mg p.o. or IM 3 times daily as needed agitation   Haldol tablets 5 mg po IM 3 times daily as needed agitation   Lorazepam tablet 2 mg p.o. or IM 3 times daily as needed agitation    Other PRN Medications -Acetaminophen 650 mg every 6 as needed/mild pain -Maalox 30 mL oral every 4 as needed/digestion -Magnesium hydroxide 30 mL  daily as needed/mild constipation  -- The risks/benefits/side-effects/alternatives to this medication were discussed in detail with the patient and time was given for questions. The patient consents to medication trial.              -- Encouraged patient to participate  in unit milieu and in scheduled group therapies    Labs reviewed: CMP: Potassium 3.2, replace with 40 mEq of potassium x 1 dose only.   EKG: Normal sinus rhythm, ventricular rate 63, QT/QTc 394/403.   Safety  and Monitoring: Voluntary admission to inpatient psychiatric unit for safety, stabilization and treatment Daily contact with patient to assess and evaluate symptoms and progress in treatment Patient's case to be discussed in multi-disciplinary team meeting Observation Level : q15 minute checks Vital signs: q12 hours Precautions: suicide, but pt currently verbally contracts for safety on unit    Discharge Planning: Social work and case management to assist with discharge planning and identification of hospital follow-up needs prior to discharge Estimated LOS: 5-7 days Discharge Concerns: Need to establish a safety plan; Medication compliance and effectiveness Discharge Goals: Return home with outpatient referrals for mental health follow-up including medication management/psychotherapy.  Long Term Goal(s): Improvement in symptoms so as ready for discharge  Short Term Goals: Ability to identify changes in lifestyle to reduce recurrence of condition will improve, Ability to verbalize feelings will improve, Ability to disclose and discuss suicidal ideas, Ability to demonstrate self-control will improve, Ability to identify and develop effective coping behaviors will improve, Ability to maintain clinical measurements within normal limits will improve, Compliance with prescribed medications will improve, and Ability to identify triggers associated with substance abuse/mental health issues will improve  Physician Treatment  Plan for Secondary Diagnosis: Principal Problem:   MDD (major depressive disorder), severe (HCC)  I certify that inpatient services furnished can reasonably be expected to improve the patient's condition.    Cecilie Lowers, FNP 8/16/20247:20 PM

## 2023-08-08 NOTE — Progress Notes (Signed)
Pt's mother called and asked to speak to charge nurse. Came to visit her son today and is concerned about his plan of care. "He doesn't need to be here. He is developmentally delayed and has been his entire life. They say he is diagnosed with major depression which may be so, but he doesn't need to be in here. I have a therapy appointment set up for him for Thursday. He seems okay but he is anxious because he doesn't understand what is going on and why he is here." Provided support. Encouraged mother to call in the morning after 8:30 to speak to a provider about the plan of care. Encouraged her to request a family meeting. Will pass this information on to the charge nurse in the morning.

## 2023-08-09 DIAGNOSIS — F322 Major depressive disorder, single episode, severe without psychotic features: Secondary | ICD-10-CM | POA: Diagnosis not present

## 2023-08-09 MED ORDER — SERTRALINE HCL 50 MG PO TABS
50.0000 mg | ORAL_TABLET | Freq: Every day | ORAL | Status: DC
  Administered 2023-08-10 – 2023-08-11 (×2): 50 mg via ORAL
  Filled 2023-08-09 (×3): qty 1

## 2023-08-09 NOTE — Plan of Care (Signed)
  Problem: Education: Goal: Emotional status will improve Outcome: Progressing Goal: Mental status will improve Outcome: Progressing   Problem: Activity: Goal: Sleeping patterns will improve Outcome: Progressing   Problem: Coping: Goal: Ability to verbalize frustrations and anger appropriately will improve Outcome: Progressing   Problem: Safety: Goal: Periods of time without injury will increase Outcome: Progressing   Problem: Activity: Goal: Interest or engagement in activities will improve Outcome: Progressing Goal: Sleeping patterns will improve Outcome: Progressing

## 2023-08-09 NOTE — [facility] Suicide Risk Assessment (Signed)
[facility] INPATIENT:  Family/Significant Other Suicide Prevention Education  Suicide Prevention Education:  Education Completed; Ralph Sheppard,  (Mother) has been identified by the patient as the family member/significant other with whom the patient will be residing, and identified as the person(s) who will aid the patient in the event of a mental health crisis (suicidal ideations/suicide attempt).  With written consent from the patient, the family member/significant other has been provided the following suicide prevention education, prior to the and/or following the discharge of the patient.  LCSWA spoke with mother Ralph Sheppard, she expressed that she has "SPE" plan in place at home. Mother stated that when she get calls like this "your question my parenting skills." LCSWA, apologized that it make her seem that way but it is a protocol the hospital take for safety discharge for the patient.   The suicide prevention education provided includes the following: Suicide risk factors Suicide prevention and interventions National Suicide Hotline telephone number [facility] assessment telephone number [facility] Emergency Assistance 911 [facility] and/or Residential Mobile Crisis Unit telephone number  Request made of family/significant other to: Remove weapons (e.g., guns, rifles, knives), all items previously/currently identified as safety concern.   Remove drugs/medications (over-the-counter, prescriptions, illicit drugs), all items previously/currently identified as a safety concern.  The family member/significant other verbalizes understanding of the suicide prevention education information provided.  The family member/significant other agrees to remove the items of safety concern listed above.  Ralph Sheppard 08/09/2023, 3:14 PM

## 2023-08-09 NOTE — [facility] Suicide Risk Assessment (Signed)
[facility] INPATIENT:  Family/Significant Other Suicide Prevention Education  Suicide Prevention Education:  Contact Attempts: Tadhg Suntken, (Mother) has been identified by the patient as the family member/significant other with whom the patient will be residing, and identified as the person(s) who will aid the patient in the event of a mental health crisis.  With written consent from the patient, first attempt made to provide suicide prevention education, prior to and/or following the patient's discharge.  We were unsuccessful in providing suicide prevention education.  A suicide education pamphlet was given to the patient to share with family/significant other.  Date and time of first attempt:08/09/23 at 2:57pm   Ethelle Ola O Caleyah Jr 08/09/2023, 2:56 PM
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
## 2023-08-09 NOTE — Progress Notes (Signed)
Patient withdrew 72 hour request for discharge 08/09/23 at 1000.

## 2023-08-09 NOTE — [facility] Group Notes (Signed)
[facility] Group Notes:  (Nursing/MHT/Case Management/Adjunct)  Date:  08/09/2023  Time:  10:58 PM  Type of Therapy:  Psychoeducational Skills  Participation Level:  Active  Participation Quality:  Attentive  Affect:  Appropriate  Cognitive:  Appropriate  Insight:  Improving  Engagement in Group:  Developing/Improving  Modes of Intervention:  Support  Summary of Progress/Problems: The patient shared in group that he did not meet his goal for today which was to get discharged. He did state that he had a good family visit this evening. His goal for tomorrow is to inquire about when he will be getting discharged.   Hazle Coca S 08/09/2023, 10:58 PM
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
## 2023-08-09 NOTE — Progress Notes (Signed)
D: Patient is alert, oriented, pleasant, and cooperative. Denies SI, HI, AVH, and verbally contracts for safety. Patient reports he slept good last night. Patient reports his appetite as good, energy level as normal, and concentration as good. Patient rates his depression 0/10, hopelessness 0/10, and anxiety 2/10. Patient denies physical symptoms/pain.    A: Scheduled medications administered per MD order. Support provided. Patient educated on safety on the unit and medications. Routine safety checks every 15 minutes. Patient stated understanding to tell nurse about any new physical symptoms. Patient understands to tell staff of any needs.     R: No adverse drug reactions noted. Patient verbally contracts for safety. Patient remains safe at this time and will continue to monitor.    08/09/23 1500  Psych Admission Type (Psych Patients Only)  Admission Status Voluntary  Psychosocial Assessment  Patient Complaints Anxiety  Eye Contact Fair  Facial Expression Animated  Affect Anxious  Speech Logical/coherent  Interaction Childlike  Motor Activity Fidgety  Appearance/Hygiene Unremarkable  Behavior Characteristics Cooperative;Appropriate to situation  Mood Anxious;Pleasant  Thought Process  Coherency WDL  Content WDL  Delusions None reported or observed  Perception WDL  Hallucination None reported or observed  Judgment Poor  Confusion None  Danger to Self  Current suicidal ideation? Denies  Self-Injurious Behavior No self-injurious ideation or behavior indicators observed or expressed   Agreement Not to Harm Self Yes  Description of Agreement verbal  Danger to Others  Danger to Others None reported or observed

## 2023-08-10 DIAGNOSIS — F322 Major depressive disorder, single episode, severe without psychotic features: Secondary | ICD-10-CM | POA: Diagnosis not present

## 2023-08-10 NOTE — [facility] Group Notes (Signed)
Pt did not attend goals group. 

## 2023-08-10 NOTE — [facility] Group Notes (Signed)
[facility] Group Notes:  (Nursing/MHT/Case Management/Adjunct)  Date:  08/10/2023  Time:  10:36 PM  Type of Therapy:   Group Wrap Up  Participation Level:  Active  Participation Quality:  Appropriate  Affect:  Appropriate  Cognitive:  Alert and Appropriate  Insight:  Appropriate and Good  Engagement in Group:  Engaged and Supportive  Modes of Intervention:  Socialization and Support  Summary of Progress/Problems:Pt attended group   Granville Lewis 08/10/2023, 10:36 PM

## 2023-08-10 NOTE — [facility] Group Notes (Signed)
LCSW Group Therapy Note  08/10/2023   10:00-11:00am   Type of Therapy and Topic:  Group Therapy: Anger Cues and Responses  Participation Level:  Active   Description of Group:   In this group, patients learned how to recognize the physical, cognitive, emotional, and behavioral responses they have to anger-provoking situations.  They identified a recent time they became angry and how they reacted.  They analyzed how their reaction was possibly beneficial and how it was possibly unhelpful.  The group discussed a variety of healthier coping skills that could help with such a situation in the future.  They also learned that anger is a second emotion fueled by other feelings and explored their own emotions that may frequently fuel their anger.  Focus was placed on how helpful it is to recognize the underlying emotions to our anger, because working on those can lead to a more permanent solution as well as our ability to focus on the important rather than the urgent.  Therapeutic Goals: Patients will remember their last incident of anger and how they felt emotionally and physically, what their thoughts were at the time, and how they behaved. Patients will identify how their behavior at that time worked for them, as well as how it worked against them. Patients will explore possible new behaviors to use in future anger situations. Patients will learn that anger itself is normal and cannot be eliminated, and that healthier reactions can assist with resolving conflict rather than worsening situations. Patients will learn that anger is a secondary emotion and worked to identify some of the underlying feelings that may lead to anger.  Summary of Patient Progress:  Patient was present and an active listener through out session. Patient reports a trigger for anger to be wanting to live his own life and being different. Patient reports it "pisses me off."   Therapeutic Modalities:   Cognitive Behavioral  Therapy  Shellia Cleverly

## 2023-08-10 NOTE — Progress Notes (Signed)
Psychiatric progress note  Patient Identification: Ralph Sheppard MRN:  [id number] Date of Evaluation:  08/10/2023 Chief Complaint:  MDD (major depressive disorder), severe (HCC) [F32.2] Principal Diagnosis: MDD (major depressive disorder), severe (HCC) Diagnosis:  Principal Problem:   MDD (major depressive disorder), severe (HCC)  Reason for admission   CC: " The EMS took me to the hospital because they think I was trying to jump from the bridge." History of Present Illness: Ralph Sheppard is a 27 year old African-American homeless male with prior psychiatric history significant for depression and anxiety who presents voluntarily to [facility] from [facility] ED department for being brought by EMS for walking on a ledge of a bridge.  This is this patient's first inpatient psychiatric admission.  During this evaluation, patient reports the same information provided at the ED as follows:  "He has history of depression and anxiety and was brought in by EMS after being found walking on a ledge of a bridge.  Patient tells me that he is very fanatical about gymnastics and that he was not intending to jump.  However, he does admit to significant depression and states that he is in a dark place.  He admits to "crying on the inside" and anhedonia but he denies early morning wakening.  He denies hallucinations.  He states that he has had suicidal ideation in the remote past (running in front of a car) but denies suicidal ideation currently.  He is not under the care of a psychiatrist or therapist.  He denies ethanol and drug use."   Chart review from last 24 hours   Staff reports that the patient has retracted the 72-hour request for discharge.  He is compliant with medications.He denied side effects.  He continues to be very isolative and socially withdrawn.  Staff reports that he slept 6.75 hours.   Yesterday, the psychiatry team made the following recommendations:    Begin Zoloft and continue to titrate  it up.  Obtain additional collateral information from the family.   Information obtained during interview     The patient was seen and evaluated today.  The chart was reviewed.  He remained withdrawn and guarded and withholding of information.  Initially he claimed that his depression was 0/10, his anxiety was 0/10 and his suicidal ideation was 0/10 and that he was fine and there was nothing wrong with him.  He admits to sleeping well.  After he was made aware of his resistance, he admits that he has depression and also admits that he has some ongoing conflicts at home with his father and that he has been ruminating over multiple stressors. He also talked briefly about his "Park[employer]" activities and states that he does that sometimes when he is stressed.  He is able to acknowledge that that can be dangerous at times.  He is willing to stay at least every Tuesday or Wednesday to be stabilized.  Collateral from Mother: Collateral was obtained from the patient's mother yesterday who seems very motivated to get him out of the hospital and willing to see him and monitor him 24/7.  Apparently he has an appointment on Wednesday with an outpatient therapist and the plan is to discharge him on Tuesday or Wednesday directly to the therapist.  Associated Signs/Symptoms: Depression Symptoms:  depressed mood, anhedonia, fatigue, feelings of worthlessness/guilt, difficulty concentrating, hopelessness, anxiety, loss of energy/fatigue, disturbed sleep, weight loss, decreased labido, decreased appetite,  (Hypo) Manic Symptoms:  Impulsivity,  Anxiety Symptoms:  Excessive Worry, Social Anxiety,  Psychotic Symptoms:   Not applicable  PTSD Symptoms: NA  Total Time spent with patient: 45 minutes  Is the patient at risk to self? Yes.    Has the patient been a risk to self in the past 6 months? No.  Has the patient been a risk to self within the distant past? No.  Is the patient a risk to others? No.   Has the patient been a risk to others in the past 6 months? No.  Has the patient been a risk to others within the distant past? No.   [place] Scale:  Flowsheet Row Ad[facility] (Current) [facility] from 08/05/2023 in [facility] ED from 02/26/2023 in [facility] Emergency Department at [facility]  C-SSRS RISK CATEGORY Moderate Risk High Risk No Risk      Alcohol Screening: 1. How often do you have a drink containing alcohol?: Never 2. How many drinks containing alcohol do you have on a typical day when you are drinking?: 1 or 2 3. How often do you have six or more drinks on one occasion?: Never AUDIT-C Score: 0 4. How often during the last year have you found that you were not able to stop drinking once you had started?: Never 5. How often during the last year have you failed to do what was normally expected from you because of drinking?: Never 6. How often during the last year have you needed a first drink in the morning to get yourself going after a heavy drinking session?: Never 7. How often during the last year have you had a feeling of guilt of remorse after drinking?: Never 8. How often during the last year have you been unable to remember what happened the night before because you had been drinking?: Never 9. Have you or someone else been injured as a result of your drinking?: No 10. Has a relative or friend or a doctor or another health worker been concerned about your drinking or suggested you cut down?: No Alcohol Use Disorder Identification Test Final Score (AUDIT): 0 Alcohol Brief Interventions/Follow-up: Alcohol education/Brief advice  Substance Abuse History in the last 12 months:  No.  Consequences of Substance Abuse: NA Previous Psychotropic Medications: No   Psychological Evaluations: No  Past Medical History:  Past Medical History:  Diagnosis Date   ADHD     Asthma     Past Surgical History:  Procedure Laterality Date   WISDOM TOOTH EXTRACTION     Family History:  Family History  Problem Relation Age of Onset   Asthma Mother    Diabetes Mother    Diabetes Father    Hypertension Father    Tobacco Screening:  Social History   Tobacco Use  Smoking Status Never  Smokeless Tobacco Never    BH Tobacco Counseling     Are you interested in Tobacco Cessation Medications?  No value filed. Counseled patient on smoking cessation:  No value filed. Reason Tobacco Screening Not Completed: No value filed.   Social History:  Social History   Substance and Sexual Activity  Alcohol Use Never     Social History   Substance and Sexual Activity  Drug Use Never    Additional Social History: Marital status: Single Are you sexually active?: No What is your sexual orientation?: Heterosexual Has your sexual activity been affected by drugs, alcohol, medication, or emotional stress?: None reported Does patient have children?: No  Allergies:  No Known Allergies Lab Results: No results found for this or any previous visit (from the past 48 hour(s)).  Blood Alcohol level:  Lab Results  Component Value Date   ETH <10 08/06/2023   Metabolic Disorder Labs:  No results found for: "HGBA1C", "MPG" No results found for: "PROLACTIN" No results found for: "CHOL", "TRIG", "HDL", "CHOLHDL", "VLDL", "LDLCALC"  Current Medications: Current Facility-Administered Medications  Medication Dose Route Frequency Provider Last Rate Last Admin   acetaminophen (TYLENOL) tablet 650 mg  650 mg Oral Q6H PRN Onuoha, Chinwendu V, NP       alum & mag hydroxide-simeth (MAALOX/MYLANTA) 200-200-20 MG/5ML suspension 30 mL  30 mL Oral Q4H PRN Onuoha, Chinwendu V, NP       haloperidol (HALDOL) tablet 5 mg  5 mg Oral TID PRN Onuoha, Chinwendu V, NP       Or   haloperidol lactate (HALDOL) injection 5 mg  5 mg Intramuscular TID PRN Onuoha, Chinwendu V, NP        hydrOXYzine (ATARAX) tablet 25 mg  25 mg Oral TID PRN Onuoha, Chinwendu V, NP   25 mg at 08/09/23 2121   LORazepam (ATIVAN) tablet 2 mg  2 mg Oral TID PRN Onuoha, Chinwendu V, NP       Or   LORazepam (ATIVAN) injection 2 mg  2 mg Intramuscular TID PRN Onuoha, Chinwendu V, NP       magnesium hydroxide (MILK OF MAGNESIA) suspension 30 mL  30 mL Oral Daily PRN Onuoha, Chinwendu V, NP       sertraline (ZOLOFT) tablet 50 mg  50 mg Oral Daily Rex Kras, MD   50 mg at 08/10/23 0747   traZODone (DESYREL) tablet 50 mg  50 mg Oral QHS PRN Onuoha, Chinwendu V, NP   50 mg at 08/09/23 2121   PTA Medications: No medications prior to admission.   Musculoskeletal: Strength & Muscle Tone: within normal limits Gait & Station: normal Patient leans: N/A  Psychiatric Specialty Exam:  Presentation  General Appearance:  Casual  Eye Contact: Fair  Speech: Slow  Speech Volume: Decreased  Handedness: Right  Mood and Affect  Mood: Anxious; Depressed  Affect: Restricted  Thought Process  Thought Processes: Linear  Duration of Psychotic Symptoms:N/A Past Diagnosis of Schizophrenia or Psychoactive disorder: No  Descriptions of Associations:Intact  Orientation:Full (Time, Place and Person)  Thought Content:Rumination  Hallucinations:Hallucinations: None  Ideas of Reference:None  Suicidal Thoughts:Suicidal Thoughts: Yes, Passive SI Passive Intent and/or Plan: Without Intent; Without Plan  Homicidal Thoughts:Homicidal Thoughts: No  Sensorium  Memory: Immediate Fair; Recent Fair; Remote Fair  Judgment: Poor  Insight: Poor  Executive Functions  Concentration: Fair  Attention Span: Fair  Recall: [employer] of Knowledge: Fair  Language: Fair  Psychomotor Activity  Psychomotor Activity: Psychomotor Activity: Normal  Assets  Assets: Desire for Improvement; Housing; Social Support  Sleep  Sleep: Sleep: Fair  Physical Exam: Physical Exam Vitals and  nursing note reviewed.  Constitutional:      Appearance: Normal appearance.  HENT:     Head: Normocephalic.     Nose: Nose normal.     Mouth/Throat:     Mouth: Mucous membranes are moist.  Eyes:     Pupils: Pupils are equal, round, and reactive to light.  Cardiovascular:     Rate and Rhythm: Normal rate.     Pulses: Normal pulses.  Pulmonary:     Effort: Pulmonary effort is normal.  Abdominal:     Comments: Deferred  Genitourinary:    Comments: Deferred Musculoskeletal:        General: Normal range of motion.     Cervical back: Normal range of motion.  Skin:    General: Skin is warm.  Neurological:     General: No focal deficit present.     Mental Status: He is alert and oriented to person, place, and time.  Psychiatric:        Mood and Affect: Mood normal.        Behavior: Behavior normal.    Review of Systems  Constitutional:  Negative for chills and fever.  HENT:  Negative for sore throat.   Eyes:  Negative for blurred vision.  Respiratory:  Negative for cough, shortness of breath and wheezing.   Cardiovascular:  Negative for chest pain and palpitations.  Gastrointestinal:  Negative for abdominal pain, heartburn, nausea and vomiting.  Genitourinary: Negative.   Musculoskeletal: Negative.   Skin:  Negative for itching and rash.  Neurological:  Negative for dizziness, tingling and headaches.  Endo/Heme/Allergies:        See allergy listing  Psychiatric/Behavioral:  Positive for depression. The patient is nervous/anxious and has insomnia.    Blood pressure 109/85, pulse 88, temperature 98.4 F (36.9 C), temperature source Oral, resp. rate 14, height 5\' 9"  (1.753 m), weight 63.9 kg, SpO2 100%. Body mass index is 20.79 kg/m.  Treatment Plan Summary: Daily contact with patient to assess and evaluate symptoms and progress in treatment and Medication management  Physician Treatment Plan for Primary Diagnosis:  Assessment: MDD (major depressive disorder), severe  (HCC)  Plan: Medication: -Increase sertraline tablet 50 mg p.o. daily for depression -Continue trazodone tablet 50 mg p.o. nightly as needed for insomnia -Continue hydroxyzine tablets 25 mg p.o. 3 times daily as needed for anxiety  Agitation protocol: Benadryl capsule 50 mg p.o. or IM 3 times daily as needed agitation   Haldol tablets 5 mg po IM 3 times daily as needed agitation   Lorazepam tablet 2 mg p.o. or IM 3 times daily as needed agitation    Other PRN Medications -Acetaminophen 650 mg every 6 as needed/mild pain -Maalox 30 mL oral every 4 as needed/digestion -Magnesium hydroxide 30 mL daily as needed/mild constipation  -- The risks/benefits/side-effects/alternatives to this medication were discussed in detail with the patient and time was given for questions. The patient consents to medication trial.              -- Encouraged patient to participate  in unit milieu and in scheduled group therapies    Labs reviewed: CMP: Potassium 3.2, replace with 40 mEq of potassium x 1 dose only.   EKG: Normal sinus rhythm, ventricular rate 63, QT/QTc 394/403.   Safety and Monitoring: Voluntary admission to inpatient psychiatric unit for safety, stabilization and treatment Daily contact with patient to assess and evaluate symptoms and progress in treatment Patient's case to be discussed in multi-disciplinary team meeting Observation Level : q15 minute checks Vital signs: q12 hours Precautions: suicide, but pt currently verbally contracts for safety on unit    Discharge Planning: Social work and case management to assist with discharge planning and identification of hospital follow-up needs prior to discharge Estimated LOS: The patient has retracted the 72-hour notice.  Possible discharge by Tuesday with a safety plan.  Discharge Concerns: Need to establish a safety plan; Medication compliance and effectiveness Discharge Goals: Return home with outpatient referrals for mental health  follow-up including medication management/psychotherapy.  Long Term Goal(s): Improvement in symptoms  so as ready for discharge  Short Term Goals: Ability to identify changes in lifestyle to reduce recurrence of condition will improve, Ability to verbalize feelings will improve, Ability to disclose and discuss suicidal ideas, Ability to demonstrate self-control will improve, Ability to identify and develop effective coping behaviors will improve, Ability to maintain clinical measurements within normal limits will improve, Compliance with prescribed medications will improve, and Ability to identify triggers associated with substance abuse/mental health issues will improve  Physician Treatment Plan for Secondary Diagnosis: Principal Problem:   MDD (major depressive disorder), severe (HCC)  I certify that inpatient services furnished can reasonably be expected to improve the patient's condition.    Rex Kras, MD 8/18/202410:28 AM Patient ID: Ralph Sheppard, male   DOB: [birth year], 27 y.o.   MRN: [id number] Patient ID: Ralph Sheppard, male   DOB: Mar 16, 1996, 27 y.o.   MRN: [id number]

## 2023-08-10 NOTE — Progress Notes (Signed)
   08/10/23 2137  Psych Admission Type (Psych Patients Only)  Admission Status Voluntary  Psychosocial Assessment  Patient Complaints Anxiety  Eye Contact Fair  Facial Expression Animated  Affect Anxious  Speech Logical/coherent  Interaction Childlike  Motor Activity Fidgety  Appearance/Hygiene Unremarkable  Behavior Characteristics Cooperative;Appropriate to situation  Mood Anxious;Pleasant  Thought Process  Coherency WDL  Content WDL  Delusions None reported or observed  Perception WDL  Hallucination None reported or observed  Judgment Poor  Confusion None  Danger to Self  Current suicidal ideation? Denies  Self-Injurious Behavior No self-injurious ideation or behavior indicators observed or expressed   Agreement Not to Harm Self Yes  Description of Agreement verbal  Danger to Others  Danger to Others None reported or observed

## 2023-08-10 NOTE — Progress Notes (Signed)
   08/10/23 1100  Psych Admission Type (Psych Patients Only)  Admission Status Voluntary  Psychosocial Assessment  Patient Complaints Anxiety  Eye Contact Fair  Facial Expression Animated  Affect Anxious  Speech Logical/coherent  Interaction Childlike  Motor Activity Fidgety  Appearance/Hygiene Unremarkable  Behavior Characteristics Cooperative;Appropriate to situation  Mood Anxious;Pleasant  Thought Process  Coherency WDL  Content WDL  Delusions None reported or observed  Perception WDL  Hallucination None reported or observed  Judgment Poor  Confusion None  Danger to Self  Current suicidal ideation? Denies  Self-Injurious Behavior No self-injurious ideation or behavior indicators observed or expressed   Agreement Not to Harm Self Yes  Description of Agreement verbal  Danger to Others  Danger to Others None reported or observed

## 2023-08-10 NOTE — Plan of Care (Signed)

## 2023-08-11 DIAGNOSIS — F322 Major depressive disorder, single episode, severe without psychotic features: Secondary | ICD-10-CM | POA: Diagnosis not present

## 2023-08-11 MED ORDER — SERTRALINE HCL 50 MG PO TABS
75.0000 mg | ORAL_TABLET | Freq: Every day | ORAL | Status: DC
  Administered 2023-08-12: 75 mg via ORAL
  Filled 2023-08-11 (×3): qty 1

## 2023-08-11 MED ORDER — SERTRALINE HCL 25 MG PO TABS
25.0000 mg | ORAL_TABLET | Freq: Once | ORAL | Status: AC
  Administered 2023-08-11: 25 mg via ORAL
  Filled 2023-08-11 (×2): qty 1

## 2023-08-11 NOTE — Progress Notes (Signed)
Pt very anxious and appeared paranoid on the unit this evening, but pt was pleasant and appeared to listen to information given by writer    08/11/23 2245  Psych Admission Type (Psych Patients Only)  Admission Status Voluntary  Psychosocial Assessment  Patient Complaints Anxiety  Eye Contact Fair  Facial Expression Anxious  Affect Anxious  Speech Logical/coherent  Interaction Childlike;Defensive  Motor Activity Fidgety  Appearance/Hygiene Unremarkable  Behavior Characteristics Cooperative;Appropriate to situation;Anxious  Mood Pleasant;Anxious  Aggressive Behavior  Effect No apparent injury  Thought Process  Coherency WDL  Content WDL  Delusions WDL  Perception WDL  Hallucination None reported or observed  Judgment Poor  Confusion None  Danger to Self  Current suicidal ideation? Denies

## 2023-08-11 NOTE — Plan of Care (Signed)
  Problem: Activity: Goal: Sleeping patterns will improve Outcome: Progressing   Problem: Coping: Goal: Ability to demonstrate self-control will improve Outcome: Progressing   Problem: Activity: Goal: Sleeping patterns will improve Outcome: Progressing   Problem: Coping: Goal: Ability to demonstrate self-control will improve Outcome: Progressing

## 2023-08-11 NOTE — Progress Notes (Signed)
Psychiatric progress note  Patient Identification: Ralph Sheppard MRN:  [id number] Date of Evaluation:  08/11/2023 Chief Complaint:  MDD (major depressive disorder), severe (HCC) [F32.2] Principal Diagnosis: MDD (major depressive disorder), severe (HCC) Diagnosis:  Principal Problem:   MDD (major depressive disorder), severe (HCC)  Reason for admission   CC: " The EMS took me to the hospital because they think I was trying to jump from the bridge." History of Present Illness: Ralph Sheppard is a 27 year old African-American homeless male with prior psychiatric history significant for depression and anxiety who presents voluntarily to [facility] from [facility] ED department for being brought by EMS for walking on a ledge of a bridge.  This is this patient's first inpatient psychiatric admission.  During this evaluation, patient reports the same information provided at the ED as follows:  "He has history of depression and anxiety and was brought in by EMS after being found walking on a ledge of a bridge.  Patient tells me that he is very fanatical about gymnastics and that he was not intending to jump.  However, he does admit to significant depression and states that he is in a dark place.  He admits to "crying on the inside" and anhedonia but he denies early morning wakening.  He denies hallucinations.  He states that he has had suicidal ideation in the remote past (running in front of a car) but denies suicidal ideation currently.  He is not under the care of a psychiatrist or therapist.  He denies ethanol and drug use."   Chart review from last 24 hours   Staff reports that the patient slept fair.  He has been in denial of all symptoms.  No side effects are noted.  He is attending groups.  He is a little bit more cooperative with the staff.  He slept 7 hours.  Yesterday, the psychiatry team made the following recommendations:    Zoloft 50 mg a day.   Information obtained during interview      The patient was seen and evaluated today.  The chart was reviewed.  Today he was alert and oriented cooperative and although initially denied all symptoms and claims that his depression is 0/10, he was able to verbalize the fact that he felt pretty miserable before admission and that he was quite depressed.  He did not with fair visit with his mother who came to see him yesterday.  He claims that he did not go as well as he thought it would.  Apparently she does not want him to do anymore of the"Park[employer]" activities which he enjoys.  He does understand that he should be going up on a bridge and trying acrobatics and has not an expedited.  He continues to contract for safety.  He agrees to take medications as prescribed.  He is able to endorse the fact that he has some conflicts that are ongoing with his father. When seen today he denied SI/HI/AVH. Collateral from Mother: Patient's mother was contacted daily.  On the current physician with her yesterday and today initially she wanted the patient to be switched from Zoloft to Lexapro.  However she understands that that might delay his discharge.  She is concerned of potential side effects on Zoloft.  So far he has not had any side effects.  She is also insisting that he be discharged as soon as possible. After discussion with her she was in agreement for discharge tomorrow with a safety plan and outpatient follow-up appointments.  The plan is to increase Zoloft to 75 mg a day.  Associated Signs/Symptoms: Depression Symptoms:  depressed mood, anhedonia, fatigue, feelings of worthlessness/guilt, difficulty concentrating, hopelessness, anxiety, loss of energy/fatigue, disturbed sleep, weight loss, decreased labido, decreased appetite,  (Hypo) Manic Symptoms:  Impulsivity,  Anxiety Symptoms:  Excessive Worry, Social Anxiety,  Psychotic Symptoms:   Not applicable  PTSD Symptoms: NA  Total Time spent with patient: 45 minutes  Is the patient  at risk to self? Yes.    Has the patient been a risk to self in the past 6 months? No.  Has the patient been a risk to self within the distant past? No.  Is the patient a risk to others? No.  Has the patient been a risk to others in the past 6 months? No.  Has the patient been a risk to others within the distant past? No.   [place] Scale:  Flowsheet Row [facility]) [facility] from 08/05/2023 in [facility] Department at [facility] ED from 02/26/2023 in [facility] Emergency Department at [facility]  C-SSRS RISK CATEGORY Moderate Risk High Risk No Risk      Alcohol Screening: 1. How often do you have a drink containing alcohol?: Never 2. How many drinks containing alcohol do you have on a typical day when you are drinking?: 1 or 2 3. How often do you have six or more drinks on one occasion?: Never AUDIT-C Score: 0 4. How often during the last year have you found that you were not able to stop drinking once you had started?: Never 5. How often during the last year have you failed to do what was normally expected from you because of drinking?: Never 6. How often during the last year have you needed a first drink in the morning to get yourself going after a heavy drinking session?: Never 7. How often during the last year have you had a feeling of guilt of remorse after drinking?: Never 8. How often during the last year have you been unable to remember what happened the night before because you had been drinking?: Never 9. Have you or someone else been injured as a result of your drinking?: No 10. Has a relative or friend or a doctor or another health worker been concerned about your drinking or suggested you cut down?: No Alcohol Use Disorder Identification Test Final Score (AUDIT): 0 Alcohol Brief Interventions/Follow-up: Alcohol education/Brief advice  Substance Abuse History in the last 12 months:   No.  Consequences of Substance Abuse: NA Previous Psychotropic Medications: No   Psychological Evaluations: No  Past Medical History:  Past Medical History:  Diagnosis Date   ADHD    Asthma     Past Surgical History:  Procedure Laterality Date   WISDOM TOOTH EXTRACTION     Family History:  Family History  Problem Relation Age of Onset   Asthma Mother    Diabetes Mother    Diabetes Father    Hypertension Father    Tobacco Screening:  Social History   Tobacco Use  Smoking Status Never  Smokeless Tobacco Never    BH Tobacco Counseling     Are you interested in Tobacco Cessation Medications?  No value filed. Counseled patient on smoking cessation:  No value filed. Reason Tobacco Screening Not Completed: No value filed.   Social History:  Social History   Substance and Sexual Activity  Alcohol Use Never  Social History   Substance and Sexual Activity  Drug Use Never    Additional Social History: Marital status: Single Are you sexually active?: No What is your sexual orientation?: Heterosexual Has your sexual activity been affected by drugs, alcohol, medication, or emotional stress?: None reported Does patient have children?: No    Allergies:  No Known Allergies Lab Results: No results found for this or any previous visit (from the past 48 hour(s)).  Blood Alcohol level:  Lab Results  Component Value Date   ETH <10 08/06/2023   Metabolic Disorder Labs:  No results found for: "HGBA1C", "MPG" No results found for: "PROLACTIN" No results found for: "CHOL", "TRIG", "HDL", "CHOLHDL", "VLDL", "LDLCALC"  Current Medications: Current Facility-Administered Medications  Medication Dose Route Frequency Provider Last Rate Last Admin   acetaminophen (TYLENOL) tablet 650 mg  650 mg Oral Q6H PRN Onuoha, Chinwendu V, NP       alum & mag hydroxide-simeth (MAALOX/MYLANTA) 200-200-20 MG/5ML suspension 30 mL  30 mL Oral Q4H PRN Onuoha, Chinwendu V, NP        haloperidol (HALDOL) tablet 5 mg  5 mg Oral TID PRN Onuoha, Chinwendu V, NP       Or   haloperidol lactate (HALDOL) injection 5 mg  5 mg Intramuscular TID PRN Onuoha, Chinwendu V, NP       hydrOXYzine (ATARAX) tablet 25 mg  25 mg Oral TID PRN Onuoha, Chinwendu V, NP   25 mg at 08/10/23 2122   LORazepam (ATIVAN) tablet 2 mg  2 mg Oral TID PRN Onuoha, Chinwendu V, NP       Or   LORazepam (ATIVAN) injection 2 mg  2 mg Intramuscular TID PRN Onuoha, Chinwendu V, NP       magnesium hydroxide (MILK OF MAGNESIA) suspension 30 mL  30 mL Oral Daily PRN Onuoha, Chinwendu V, NP       [START ON 08/12/2023] sertraline (ZOLOFT) tablet 75 mg  75 mg Oral Daily Catricia Scheerer, Rulon Eisenmenger, MD       traZODone (DESYREL) tablet 50 mg  50 mg Oral QHS PRN Onuoha, Chinwendu V, NP   50 mg at 08/10/23 2122   PTA Medications: No medications prior to admission.   Musculoskeletal: Strength & Muscle Tone: within normal limits Gait & Station: normal Patient leans: N/A  Psychiatric Specialty Exam:  Presentation  General Appearance:  Casual  Eye Contact: Fair  Speech: Slow  Speech Volume: Decreased  Handedness: Right  Mood and Affect  Mood: Depressed; Anxious  Affect: Constricted  Thought Process  Thought Processes: Linear  Duration of Psychotic Symptoms:N/A Past Diagnosis of Schizophrenia or Psychoactive disorder: No  Descriptions of Associations:Intact  Orientation:Full (Time, Place and Person)  Thought Content:Rumination  Hallucinations:Hallucinations: None  Ideas of Reference:None  Suicidal Thoughts:Suicidal Thoughts: No SI Passive Intent and/or Plan: Without Intent; Without Plan  Homicidal Thoughts:Homicidal Thoughts: No  Sensorium  Memory: Immediate Fair; Remote Fair; Recent Fair  Judgment: Fair  Insight: Fair  Chartered certified accountant: Fair  Attention Span: Fair  Recall: [employer] of Knowledge: Fair  Language: Fair  Psychomotor Activity  Psychomotor  Activity: Psychomotor Activity: Normal  Assets  Assets: Desire for Improvement; Communication Skills; Housing  Sleep  Sleep: Sleep: Fair  Physical Exam: Physical Exam Vitals and nursing note reviewed.  Constitutional:      Appearance: Normal appearance.  HENT:     Head: Normocephalic.     Nose: Nose normal.     Mouth/Throat:     Mouth: Mucous membranes are  moist.  Eyes:     Pupils: Pupils are equal, round, and reactive to light.  Cardiovascular:     Rate and Rhythm: Normal rate.     Pulses: Normal pulses.  Pulmonary:     Effort: Pulmonary effort is normal.  Abdominal:     Comments: Deferred  Genitourinary:    Comments: Deferred Musculoskeletal:        General: Normal range of motion.     Cervical back: Normal range of motion.  Skin:    General: Skin is warm.  Neurological:     General: No focal deficit present.     Mental Status: He is alert and oriented to person, place, and time.  Psychiatric:        Mood and Affect: Mood normal.        Behavior: Behavior normal.    Review of Systems  Constitutional:  Negative for chills and fever.  HENT:  Negative for sore throat.   Eyes:  Negative for blurred vision.  Respiratory:  Negative for cough, shortness of breath and wheezing.   Cardiovascular:  Negative for chest pain and palpitations.  Gastrointestinal:  Negative for abdominal pain, heartburn, nausea and vomiting.  Genitourinary: Negative.   Musculoskeletal: Negative.   Skin:  Negative for itching and rash.  Neurological:  Negative for dizziness, tingling and headaches.  Endo/Heme/Allergies:        See allergy listing  Psychiatric/Behavioral:  Positive for depression. The patient is nervous/anxious and has insomnia.    Blood pressure 106/85, pulse 88, temperature 98.3 F (36.8 C), temperature source Oral, resp. rate 14, height 5\' 9"  (1.753 m), weight 63.9 kg, SpO2 100%. Body mass index is 20.79 kg/m.  Treatment Plan Summary: Daily contact with patient to  assess and evaluate symptoms and progress in treatment and Medication management  Physician Treatment Plan for Primary Diagnosis:  Assessment: MDD (major depressive disorder), severe (HCC)  Plan: Medication: -Increase sertraline tablet 50 mg p.o. daily for depression -Continue trazodone tablet 50 mg p.o. nightly as needed for insomnia -Continue hydroxyzine tablets 25 mg p.o. 3 times daily as needed for anxiety  Agitation protocol: Benadryl capsule 50 mg p.o. or IM 3 times daily as needed agitation   Haldol tablets 5 mg po IM 3 times daily as needed agitation   Lorazepam tablet 2 mg p.o. or IM 3 times daily as needed agitation    Other PRN Medications -Acetaminophen 650 mg every 6 as needed/mild pain -Maalox 30 mL oral every 4 as needed/digestion -Magnesium hydroxide 30 mL daily as needed/mild constipation  -- The risks/benefits/side-effects/alternatives to this medication were discussed in detail with the patient and time was given for questions. The patient consents to medication trial.              -- Encouraged patient to participate  in unit milieu and in scheduled group therapies    Labs reviewed: CMP: Potassium 3.2, replace with 40 mEq of potassium x 1 dose only.   EKG: Normal sinus rhythm, ventricular rate 63, QT/QTc 394/403.   Safety and Monitoring: Voluntary admission to inpatient psychiatric unit for safety, stabilization and treatment Daily contact with patient to assess and evaluate symptoms and progress in treatment Patient's case to be discussed in multi-disciplinary team meeting Observation Level : q15 minute checks Vital signs: q12 hours Precautions: suicide, but pt currently verbally contracts for safety on unit    Discharge Planning: Social work and case management to assist with discharge planning and identification of hospital follow-up needs prior to  discharge Estimated LOS: The patient has retracted the 72-hour notice.  Possible discharge by Tuesday with  a safety plan.  Discharge Concerns: Need to establish a safety plan; Medication compliance and effectiveness Discharge Goals: Return home with outpatient referrals for mental health follow-up including medication management/psychotherapy.  Long Term Goal(s): Improvement in symptoms so as ready for discharge  Short Term Goals: Ability to identify changes in lifestyle to reduce recurrence of condition will improve, Ability to verbalize feelings will improve, Ability to disclose and discuss suicidal ideas, Ability to demonstrate self-control will improve, Ability to identify and develop effective coping behaviors will improve, Ability to maintain clinical measurements within normal limits will improve, Compliance with prescribed medications will improve, and Ability to identify triggers associated with substance abuse/mental health issues will improve  Physician Treatment Plan for Secondary Diagnosis: Principal Problem:   MDD (major depressive disorder), severe (HCC)  I certify that inpatient services furnished can reasonably be expected to improve the patient's condition.    Rex Kras, MD 8/19/20241:54 PM Patient ID: Ralph Sheppard, male   DOB: [birth year], 27 y.o.   MRN: [id number] Patient ID: Ralph Sheppard, male   DOB: May 06, 1996, 27 y.o.   MRN: [id number] Patient ID: Ralph Sheppard, male   DOB: 1996/04/13, 27 y.o.   MRN: [id number]

## 2023-08-11 NOTE — Group Note (Signed)
Date:  08/11/2023 Time:  6:24 PM  Group Topic/Focus:  Goals Group:   The focus of this group is to help patients establish daily goals to achieve during treatment and discuss how the patient can incorporate goal setting into their daily lives to aide in recovery.    Participation Level:  Active  Participation Quality:  Appropriate  Affect:  Appropriate  Cognitive:  Appropriate  Insight: Appropriate  Engagement in Group:  Engaged  Modes of Intervention:  Discussion  Additional Comments:  Pt goal: Get out of here" Pt hasn't talked to the doctor  Donell Beers 08/11/2023, 6:24 PM

## 2023-08-11 NOTE — Group Note (Signed)
Recreation Therapy Group Note   Group Topic:Stress Management  Group Date: 08/11/2023 Start Time: 0930 End Time: 1000 Facilitators: Francys Bolin-McCall, LRT,CTRS Location: [address] Dayroom   Goal Area(s) Addresses:  Patient will actively participate in stress management techniques presented during session.  Patient will successfully identify benefit of practicing stress management post d/c.   Group Description:  Guided Imagery. LRT provided education, instruction, and demonstration on practice of visualization via guided imagery. Patient was asked to participate in the technique introduced during session. LRT debriefed including topics of mindfulness, stress management and specific scenarios each patient could use these techniques. Patients were given suggestions of ways to access scripts post d/c and encouraged to explore Youtube and other apps available on smartphones, tablets, and computers.   Clinical Observations/Individualized Feedback: Unable to conduct group session due to maintenance being completed in dayroom.     Plan: Continue to engage patient in RT group sessions 2-3x/week.   Ralph Sheppard, LRT,CTRS 08/11/2023 12:01 PM

## 2023-08-11 NOTE — Plan of Care (Signed)

## 2023-08-11 NOTE — [facility] Group Notes (Signed)
Adult Psychoeducational Group Note  Date:  08/11/2023 Time:  9:34 PM  Group Topic/Focus:  Wrap-Up Group:   The focus of this group is to help patients review their daily goal of treatment and discuss progress on daily workbooks.  Participation Level:  Active  Participation Quality:  Appropriate  Affect:  Appropriate  Cognitive:  Appropriate  Insight: Appropriate  Engagement in Group:  Engaged  Modes of Intervention:  Discussion  Additional Comments:  Pt told that today was a good day on the unit, the highlight of which was a surprise visit from his father, whom he considers supportive. On the subject of goals for the week, Pt mentioned primarily wanting to discharge, but also said he wanted to get set up with a regular therapist on the outside. Pt rated his day a 9 out of 10.  Christ Kick 08/11/2023, 9:34 PM

## 2023-08-11 NOTE — Progress Notes (Signed)
D: Patient is alert, oriented, pleasant, and cooperative. Denies SI, HI, AVH, and verbally contracts for safety. Patient reports he slept good last night. Patient reports his appetite as good, energy level as normal, and concentration as good. Patient rates his depression 0/10, hopelessness 0/10, and anxiety 0/10. Patient denies physical symptoms/pain.   A: Scheduled medications administered per MD order. Support provided. Patient educated on safety on the unit and medications. Routine safety checks every 15 minutes. Patient stated understanding to tell nurse about any new physical symptoms. Patient understands to tell staff of any needs.     R: No adverse drug reactions noted. Patient verbally contracts for safety. Patient remains safe at this time and will continue to monitor.    08/11/23 1100  Psych Admission Type (Psych Patients Only)  Admission Status Voluntary  Psychosocial Assessment  Patient Complaints Anxiety  Eye Contact Fair  Facial Expression Animated  Affect Anxious  Speech Logical/coherent  Interaction Childlike  Motor Activity Fidgety  Appearance/Hygiene Unremarkable  Behavior Characteristics Cooperative;Appropriate to situation  Mood Anxious;Pleasant  Thought Process  Coherency WDL  Content WDL  Delusions None reported or observed  Perception WDL  Hallucination None reported or observed  Judgment Poor  Confusion None  Danger to Self  Current suicidal ideation? Denies  Self-Injurious Behavior No self-injurious ideation or behavior indicators observed or expressed   Agreement Not to Harm Self Yes  Description of Agreement verbal  Danger to Others  Danger to Others None reported or observed

## 2023-08-12 DIAGNOSIS — F322 Major depressive disorder, single episode, severe without psychotic features: Secondary | ICD-10-CM | POA: Diagnosis not present

## 2023-08-12 MED ORDER — TRAZODONE HCL 50 MG PO TABS: 50.0000 mg | ORAL_TABLET | Freq: Every evening | ORAL | 0 refills | Status: AC | PRN

## 2023-08-12 MED ORDER — SERTRALINE HCL 25 MG PO TABS: 75.0000 mg | ORAL_TABLET | Freq: Every day | ORAL | 0 refills | Status: AC

## 2023-08-12 NOTE — Progress Notes (Signed)
  [facility] Adult Case Management Discharge Plan :  Will you be returning to the same living situation after discharge:  Yes,  Patient will be returning back with his parents  At discharge, do you have transportation home?: Yes,  Patient parents will be picking him up  Do you have the ability to pay for your medications: Yes,  [employer]   Release of information consent forms completed and in the chart;  Patient's signature needed at discharge.  Patient to Follow up at:  Follow-up Information     [facility] Go on 08/26/2023.   Why: You have an appointment for therapy services on 08/26/23 at 9:00 am, in person.   You have an appointment for medication management services on 09/05/23 at 10:00 am, in person. Contact information: [address] [place] [address] [phone]                 Next level of care provider has access to [facility] Link:no  Safety Planning and Suicide Prevention discussed: Yes,  Beowulf Repetti, mother, [phone]     Has patient been referred to the Quitline?: Patient does not use tobacco/nicotine products  Patient has been referred for addiction treatment: No known substance use disorder.  Isabella Bowens, LCSWA 08/12/2023, 9:36 AM

## 2023-08-12 NOTE — Progress Notes (Signed)
Pt discharged alert and oriented x 4, calm and cooperative. Pt verbalized understanding of discharge instructions reviewed with pt and belongings returned to pt.

## 2023-08-12 NOTE — Group Note (Signed)
[facility] LCSW Group Therapy Note   Group Date: 08/12/2023 Start Time: 1100 End Time: 1140   Type of Therapy/Topic:  Group Therapy:  Emotion Regulation  Participation Level:  Minimal   Mood:  Description of Group:    The purpose of this group is to assist patients in learning to regulate negative emotions and experience positive emotions. Patients will be guided to discuss ways in which they have been vulnerable to their negative emotions. These vulnerabilities will be juxtaposed with experiences of positive emotions or situations, and patients challenged to use positive emotions to combat negative ones. Special emphasis will be placed on coping with negative emotions in conflict situations, and patients will process healthy conflict resolution skills.  Therapeutic Goals: Patient will identify two positive emotions or experiences to reflect on in order to balance out negative emotions:  Patient will label two or more emotions that they find the most difficult to experience:  Patient will be able to demonstrate positive conflict resolution skills through discussion or role plays:   Summary of Patient Progress:   Patient was present through entire group session, patient had minimal participation.     Therapeutic Modalities:   Cognitive Behavioral Therapy Feelings Identification Dialectical Behavioral Therapy   Starleen Arms, LCSW

## 2023-08-12 NOTE — [facility] Group Notes (Signed)
Spiritual care group on grief and loss facilitated by Chaplain Dyanne Carrel, [facility]  Group Goal: Support / Education around grief and loss  Members engage in facilitated group support and psycho-social education.  Group Description:  Following introductions and group rules, group members engaged in facilitated group dialogue and support around topic of loss, with particular support around experiences of loss in their lives. Group Identified types of loss (relationships / self / things) and identified patterns, circumstances, and changes that precipitate losses. Reflected on thoughts / feelings around loss, normalized grief responses, and recognized variety in grief experience. Group encouraged individual reflection on safe space and on the coping skills that they are already utilizing.  Group drew on Adlerian / Rogerian and narrative framework  Patient Progress: Ralph Sheppard attended group.  Though his verbal participation was minimal, he remained engaged throughout the session.

## 2023-08-12 NOTE — Discharge Summary (Signed)
Physician Discharge Summary Note  Patient:  Ralph Sheppard is an 27 y.o., male MRN:  [id number] DOB:  [birth year] Patient phone:  [phone] (home)  Patient address:   [address] [place] [address],  Total Time spent with patient: 30 minutes  Date of Admission:  08/07/2023 Date of Discharge: 08/12/2023  Reason for Admission:   CC: " The EMS took me to the hospital because they think I was trying to jump from the bridge." History of Present Illness: Ralph Sheppard is a 27 year old African-American homeless male with prior psychiatric history significant for depression and anxiety who presents voluntarily to [facility] from [facility] ED department for being brought by EMS for walking on a ledge of a bridge.  This is this patient's first inpatient psychiatric admission.  During this evaluation, patient reports the same information provided at the ED as follows:   "He has history of depression and anxiety and was brought in by EMS after being found walking on a ledge of a bridge.  Patient tells me that he is very fanatical about gymnastics and that he was not intending to jump.  However, he does admit to significant depression and states that he is in a dark place.  He admits to "crying on the inside" and anhedonia but he denies early morning wakening.  He denies hallucinations.  He states that he has had suicidal ideation in the remote past (running in front of a car) but denies suicidal ideation currently.  He is not under the care of a psychiatrist or therapist.  He denies ethanol and drug use."   Principal Problem: MDD (major depressive disorder), severe (HCC) Discharge Diagnoses: Principal Problem:   MDD (major depressive disorder), severe (HCC)   Past Psychiatric History: None  Past Medical History:  Past Medical History:  Diagnosis Date   ADHD    Asthma     Past Surgical History:  Procedure Laterality Date   WISDOM TOOTH EXTRACTION     Family History:  Family History   Problem Relation Age of Onset   Asthma Mother    Diabetes Mother    Diabetes Father    Hypertension Father    Family Psychiatric  History: Denied Social History:  Social History   Substance and Sexual Activity  Alcohol Use Never     Social History   Substance and Sexual Activity  Drug Use Never    Social History   Socioeconomic History   Marital status: Single    Spouse name: Not on file   Number of children: Not on file   Years of education: Not on file   Highest education level: Not on file  Occupational History   Not on file  Tobacco Use   Smoking status: Never   Smokeless tobacco: Never  Vaping Use   Vaping status: Never Used  Substance and Sexual Activity   Alcohol use: Never   Drug use: Never   Sexual activity: Not Currently  Other Topics Concern   Not on file  Social History Narrative   Not on file   Social Determinants of Health   Financial Resource Strain: Not on file  Food Insecurity: Food Insecurity Present (08/07/2023)   Hunger Vital Sign    Worried About Running Out of Food in the Last Year: Sometimes true    Ran Out of Food in the Last Year: Sometimes true  Transportation Needs: No Transportation Needs (08/07/2023)   PRAPARE - Administrator, Civil Service (Medical): No  Lack of Transportation (Non-Medical): No  Physical Activity: Not on file  Stress: Not on file  Social Connections: Not on file    Hospital Course:  During the patient's hospitalization, patient had extensive initial psychiatric evaluation, and follow-up psychiatric evaluations every day.  Psychiatric diagnoses provided upon initial assessment: Major Depression Disorder with SI.  Patient's psychiatric medications were adjusted on admission: Zoloft  During the hospitalization, other adjustments were made to the patient's psychiatric medication regimen: Zoloft increased to 75 mg a day.  Patient's care was discussed during the interdisciplinary team meeting  every day during the hospitalization.  The patient denied having side effects to prescribed psychiatric medication.  Gradually, patient started adjusting to milieu. The patient was evaluated each day by a clinical provider to ascertain response to treatment. Improvement was noted by the patient's report of decreasing symptoms, improved sleep and appetite, affect, medication tolerance, behavior, and participation in unit programming.  Patient was asked each day to complete a self inventory noting mood, mental status, pain, new symptoms, anxiety and concerns.    Symptoms were reported as significantly decreased or resolved completely by discharge.   On day of discharge, the patient reports that their mood is stable. The patient denied having suicidal thoughts for more than 48 hours prior to discharge.  Patient denies having homicidal thoughts.  Patient denies having auditory hallucinations.  Patient denies any visual hallucinations or other symptoms of psychosis. The patient was motivated to continue taking medication with a goal of continued improvement in mental health.   The patient reports their target psychiatric symptoms of depression responded well to the psychiatric medications, and the patient reports overall benefit other psychiatric hospitalization. Supportive psychotherapy was provided to the patient. The patient also participated in regular group therapy while hospitalized. Coping skills, problem solving as well as relaxation therapies were also part of the unit programming.  Labs were reviewed with the patient, and abnormal results were discussed with the patient.  The patient is able to verbalize their individual safety plan to this provider.  # It is recommended to the patient to continue psychiatric medications as prescribed, after discharge from the hospital.    # It is recommended to the patient to follow up with your outpatient psychiatric provider and PCP.  # It was discussed  with the patient, the impact of alcohol, drugs, tobacco have been there overall psychiatric and medical wellbeing, and total abstinence from substance use was recommended the patient.ed.  # Prescriptions provided or sent directly to preferred pharmacy at discharge. Patient agreeable to plan. Given opportunity to ask questions. Appears to feel comfortable with discharge.    # In the event of worsening symptoms, the patient is instructed to call the crisis hotline, 911 and or go to the nearest ED for appropriate evaluation and treatment of symptoms. To follow-up with primary care provider for other medical issues, concerns and or health care needs  The patient was initially very resistant to any intervention.  He denied all symptoms.  His mother was contacted and actively involved and was appraised of his condition daily.  She reports that he had some challenges with learning difficulties in school and that he had some conflicts off and on with his father.  She also reported that he was involved in an activity called "Parkour" where a group of kids apparently do acrobatic feeds and put themselves in danger at times.  He apparently enjoys start and he claims that he was doing that on the bridge.  The  the patient's mother continues to insist that he will be better off staying at home and going for outpatient therapy.  Apparently she also had some initial reservations about Zoloft but agreed to give it a try.  She has made an appointment for him as an outpatient with a therapist for Wednesday.  # Patient was discharged to home under the care of his mother with a plan to follow up as noted below.   Physical Findings: AIMS:  , ,  ,  ,    CIWA:    COWS:     Musculoskeletal: Strength & Muscle Tone: within normal limits Gait & Station: normal Patient leans: N/A   Psychiatric Specialty Exam:  Presentation  General Appearance:  Casual  Eye Contact: Fair  Speech: Slow  Speech  Volume: Normal  Handedness: Right   Mood and Affect  Mood: Euthymic  Affect: Restricted   Thought Process  Thought Processes: Coherent  Descriptions of Associations:Intact  Orientation:Full (Time, Place and Person)  Thought Content:Logical  History of Schizophrenia/Schizoaffective disorder:No  Duration of Psychotic Symptoms:No data recorded Hallucinations:Hallucinations: None  Ideas of Reference:None  Suicidal Thoughts:Suicidal Thoughts: No  Homicidal Thoughts:Homicidal Thoughts: No   Sensorium  Memory: Immediate Fair; Remote Fair; Recent Fair  Judgment: Fair  Insight: Fair   Art therapist  Concentration: Fair  Attention Span: Fair  Recall: [employer] of Knowledge: Fair  Language: Fair   Psychomotor Activity  Psychomotor Activity: Psychomotor Activity: Normal   Assets  Assets: Desire for Improvement; Housing; Social Support   Sleep  Sleep: Sleep: Good    Physical Exam: Physical Exam Vitals and nursing note reviewed.  Constitutional:      Appearance: Normal appearance.  Neurological:     General: No focal deficit present.     Mental Status: He is alert and oriented to person, place, and time. Mental status is at baseline.  Psychiatric:        Mood and Affect: Mood normal.        Behavior: Behavior normal.    Review of Systems  Psychiatric/Behavioral: Negative.    All other systems reviewed and are negative.  Blood pressure 132/87, pulse 71, temperature 98 F (36.7 C), temperature source Oral, resp. rate 14, height 5\' 9"  (1.753 m), weight 63.9 kg, SpO2 100%. Body mass index is 20.79 kg/m.   Social History   Tobacco Use  Smoking Status Never  Smokeless Tobacco Never   Tobacco Cessation:  N/A, patient does not currently use tobacco products   Blood Alcohol level:  Lab Results  Component Value Date   ETH <10 08/06/2023    Metabolic Disorder Labs:  No results found for: "HGBA1C", "MPG" No results  found for: "PROLACTIN" No results found for: "CHOL", "TRIG", "HDL", "CHOLHDL", "VLDL", "LDLCALC"  See Psychiatric Specialty Exam and Suicide Risk Assessment completed by Attending Physician prior to discharge.  Discharge destination:  Home  Is patient on multiple antipsychotic therapies at discharge:  No   Has Patient had three or more failed trials of antipsychotic monotherapy by history:  No  Recommended Plan for Multiple Antipsychotic Therapies: NA  Discharge Instructions     Diet general   Complete by: As directed    Increase activity slowly   Complete by: As directed       Allergies as of 08/12/2023   No Known Allergies      Medication List     TAKE these medications      Indication  sertraline 25 MG tablet Commonly known as:  ZOLOFT Take 3 tablets (75 mg total) by mouth daily.  Indication: Major Depressive Disorder   traZODone 50 MG tablet Commonly known as: DESYREL Take 1 tablet (50 mg total) by mouth at bedtime as needed for sleep.  Indication: Trouble Sleeping        Follow-up Information     [facility]/2023.   Why: You have an appointment for therapy services on 08/26/23 at 9:00 am, in person.   You have an appointment for medication management services on 09/05/23 at 10:00 am, in person. Contact information: [address] [place] [address] [phone]                 Follow-up recommendations:  Activity:  Patient was strongly advised to use discretion whenever he engages in"Parkour" and it may be advisable for him not to engage in this activity.  Comments: The patient was discharged with a safety plan and outpatient follow-up appointment.  The patient's mother has agreed to monitor him closely and have great to watch him 24/7.  She is also agreed to ensure that he keeps with his follow-up appointments and monitor his medications.  Prognosis is fair to guarded.  Signed: Rex Kras, MD 08/12/2023, 8:01  AM  Total Time Spent in Direct Patient Care:  I personally spent 30 minutes on the unit in direct patient care. The direct patient care time included face-to-face time with the patient, reviewing the patient's chart, communicating with other professionals, and coordinating care. Greater than 50% of this time was spent in counseling or coordinating care with the patient regarding goals of hospitalization, psycho-education, and discharge planning needs.   Rulon Eisen[facility] Psychiatrist

## 2023-08-12 NOTE — Group Note (Signed)
Recreation Therapy Group Note   Group Topic:Animal Assisted Therapy   Group Date: 08/12/2023 Start Time: 0945 End Time: 1030 Facilitators: Laylanie Kruczek-McCall, LRT,CTRS Location: [address] Dayroom   Animal-Assisted Activity (AAA) Program Checklist/Progress Notes Patient Eligibility Criteria Checklist & Daily Group note for Rec Tx Intervention  AAA/T Program Assumption of Risk Form signed by Patient/ or Parent Legal Guardian Yes  Patient is free of allergies or severe asthma Yes  Patient reports no fear of animals Yes  Patient reports no history of cruelty to animals Yes  Patient understands his/her participation is voluntary Yes  Patient washes hands before animal contact Yes  Patient washes hands after animal contact Yes   Affect/Mood: Appropriate   Participation Level: Engaged   Participation Quality: Independent   Behavior: Appropriate    Clinical Observations/Individualized Feedback: Patient attended session and interacted appropriately with therapy dog and peers. Patient asked appropriate questions about therapy dog and his training. Patient shared stories about their pets at home with group.     Plan: Continue to engage patient in RT group sessions 2-3x/week.   Wai Minotti-McCall, LRT,CTRS 08/12/2023 1:21 PM

## 2023-08-12 NOTE — [facility] Suicide Risk Assessment (Signed)
[facility] Discharge Suicide Risk Assessment   Principal Problem: MDD (major depressive disorder), severe (HCC) Discharge Diagnoses: Principal Problem:   MDD (major depressive disorder), severe (HCC)   Total Time spent with patient: 30 minutes  Musculoskeletal: Strength & Muscle Tone: within normal limits Gait & Station: normal Patient leans: N/A  Psychiatric Specialty Exam  Presentation  General Appearance:  Casual  Eye Contact: Fair  Speech: Slow  Speech Volume: Decreased  Handedness: Right   Mood and Affect  Mood: Depressed; Anxious  Duration of Depression Symptoms: Greater than two weeks  Affect: Constricted   Thought Process  Thought Processes: Linear  Descriptions of Associations:Intact  Orientation:Full (Time, Place and Person)  Thought Content:Rumination  History of Schizophrenia/Schizoaffective disorder:No  Duration of Psychotic Symptoms:No data recorded Hallucinations:Hallucinations: None  Ideas of Reference:None  Suicidal Thoughts:Suicidal Thoughts: No  Homicidal Thoughts:Homicidal Thoughts: No   Sensorium  Memory: Immediate Fair; Remote Fair; Recent Fair  Judgment: Fair  Insight: Fair   Art therapist  Concentration: Fair  Attention Span: Fair  Recall: [employer] of Knowledge: Fair  Language: Fair   Psychomotor Activity  Psychomotor Activity: Psychomotor Activity: Normal   Assets  Assets: Desire for Improvement; Communication Skills; Housing   Sleep  Sleep: Sleep: Fair   Physical Exam: Physical Exam Constitutional:      Appearance: Normal appearance.  HENT:     Head: Normocephalic.  Musculoskeletal:        General: Normal range of motion.     Cervical back: Normal range of motion.  Neurological:     General: No focal deficit present.     Mental Status: He is alert and oriented to person, place, and time. Mental status is at baseline.  Psychiatric:        Mood and Affect: Mood normal.         Behavior: Behavior normal.    Review of Systems  Psychiatric/Behavioral: Negative.    All other systems reviewed and are negative.  Blood pressure 132/87, pulse 71, temperature 98 F (36.7 C), temperature source Oral, resp. rate 14, height 5\' 9"  (1.753 m), weight 63.9 kg, SpO2 100%. Body mass index is 20.79 kg/m.  Mental Status Per Nursing Assessment::   On Admission:  Self-harm behaviors  Demographic Factors:  Male and NA  Loss Factors: NA  Historical Factors: NA  Risk Reduction Factors:   Employed, Living with another person, especially a relative, and Positive social support  Continued Clinical Symptoms:  Depression:   Impulsivity  Cognitive Features That Contribute To Risk:  None    Suicide Risk:  Mild:  Suicidal ideation of limited frequency, intensity, duration, and specificity.  There are no identifiable plans, no associated intent, mild dysphoria and related symptoms, good self-control (both objective and subjective assessment), few other risk factors, and identifiable protective factors, including available and accessible social support.   Follow-up Information     [facility] Go on 08/26/2023.   Why: You have an appointment for therapy services on 08/26/23 at 9:00 am, in person.   You have an appointment for medication management services on 09/05/23 at 10:00 am, in person. Contact information: [address] [place] [address] [phone]                 Plan Of Care/Follow-up recommendations:  Activity:  As tolerated.  Rex Kras, MD 08/12/2023, 7:54 AM

## 2023-08-12 NOTE — Progress Notes (Signed)
   08/12/23 0829  Psych Admission Type (Psych Patients Only)  Admission Status Voluntary  Psychosocial Assessment  Patient Complaints None  Eye Contact Fair  Facial Expression Animated  Affect Appropriate to circumstance  Speech Logical/coherent  Interaction Assertive  Motor Activity Other (Comment) (unremarkable)  Appearance/Hygiene Unremarkable  Behavior Characteristics Cooperative  Mood Pleasant  Thought Process  Coherency WDL  Content WDL  Delusions WDL  Perception WDL  Hallucination None reported or observed  Judgment WDL  Confusion None  Danger to Self  Current suicidal ideation? Denies  Agreement Not to Harm Self Yes  Description of Agreement verbal  Danger to Others  Danger to Others None reported or observed

## 2023-08-30 ENCOUNTER — Telehealth (HOSPITAL_COMMUNITY): Payer: No Typology Code available for payment source | Admitting: Psychiatry

## 2023-09-03 ENCOUNTER — Ambulatory Visit (HOSPITAL_COMMUNITY): Payer: No Typology Code available for payment source | Admitting: Licensed Clinical Social Worker

## 2023-11-09 ENCOUNTER — Other Ambulatory Visit: Payer: Self-pay

## 2023-11-09 DIAGNOSIS — J45909 Unspecified asthma, uncomplicated: Secondary | ICD-10-CM | POA: Diagnosis not present

## 2023-11-09 DIAGNOSIS — H6123 Impacted cerumen, bilateral: Secondary | ICD-10-CM | POA: Insufficient documentation

## 2023-11-09 DIAGNOSIS — H9203 Otalgia, bilateral: Secondary | ICD-10-CM | POA: Diagnosis present

## 2023-11-10 ENCOUNTER — Other Ambulatory Visit: Payer: Self-pay

## 2023-11-10 ENCOUNTER — Encounter (HOSPITAL_BASED_OUTPATIENT_CLINIC_OR_DEPARTMENT_OTHER): Payer: Self-pay | Admitting: Emergency Medicine

## 2023-11-10 ENCOUNTER — Emergency Department (HOSPITAL_BASED_OUTPATIENT_CLINIC_OR_DEPARTMENT_OTHER)
Admission: EM | Admit: 2023-11-10 | Discharge: 2023-11-10 | Disposition: A | Discharge status: Home / Self Care | Payer: MEDICAID | Attending: Emergency Medicine | Admitting: Emergency Medicine

## 2023-11-10 DIAGNOSIS — H6123 Impacted cerumen, bilateral: Secondary | ICD-10-CM | POA: Insufficient documentation

## 2023-11-10 DIAGNOSIS — J45909 Unspecified asthma, uncomplicated: Secondary | ICD-10-CM | POA: Insufficient documentation

## 2023-11-10 MED ORDER — NAPROXEN 250 MG PO TABS
500.0000 mg | ORAL_TABLET | Freq: Once | ORAL | Status: AC
  Administered 2023-11-10: 500 mg via ORAL
  Filled 2023-11-10: qty 2

## 2023-11-10 MED ORDER — CARBAMIDE PEROXIDE 6.5 % OT SOLN: 5.0000 [drp] | Freq: Two times a day (BID) | OTIC | 0 refills | Status: AC

## 2023-11-10 NOTE — ED Provider Notes (Signed)
[facility] Emergency Department Provider Note MRN:  [id number]  Arrival date & time: 11/10/23     Chief Complaint   Cerumen Impaction   History of Present Illness   Ralph Sheppard is a 27 y.o. year-old male with no pertinent past medical history presenting to the ED with chief complaint of cerumen impaction.  Just seen an hour ago for ear pain and diagnosed with bilateral cerumen impaction.  Wants the wax removed.  Review of Systems  A thorough review of systems was obtained and all systems are negative except as noted in the HPI and PMH.   Patient's Health History    Past Medical History:  Diagnosis Date   ADHD    Asthma     Past Surgical History:  Procedure Laterality Date   WISDOM TOOTH EXTRACTION      Family History  Problem Relation Age of Onset   Asthma Mother    Diabetes Mother    Diabetes Father    Hypertension Father     Social History   Socioeconomic History   Marital status: Single    Spouse name: Not on file   Number of children: Not on file   Years of education: Not on file   Highest education level: Not on file  Occupational History   Not on file  Tobacco Use   Smoking status: Never   Smokeless tobacco: Never  Vaping Use   Vaping status: Never Used  Substance and Sexual Activity   Alcohol use: Never   Drug use: Never   Sexual activity: Not Currently  Other Topics Concern   Not on file  Social History Narrative   Not on file   Social Determinants of Health   Financial Resource Strain: Not on file  Food Insecurity: Food Insecurity Present (08/07/2023)   Hunger Vital Sign    Worried About Running Out of Food in the Last Year: Sometimes true    Ran Out of Food in the Last Year: Sometimes true  Transportation Needs: No Transportation Needs (08/07/2023)   PRAPARE - Administrator, Civil Service (Medical): No    Lack of Transportation (Non-Medical): No  Physical Activity: Not on file  Stress: Not on  file  Social Connections: Not on file  Intimate Partner Violence: Not At Risk (08/07/2023)   Humiliation, Afraid, Rape, and Kick questionnaire    Fear of Current or Ex-Partner: No    Emotionally Abused: No    Physically Abused: No    Sexually Abused: No     Physical Exam   Vitals:   11/10/23 0125 11/10/23 0126  BP: (!) 132/96   Pulse:    Resp:    Temp:  98.1 F (36.7 C)  SpO2:      CONSTITUTIONAL: Well-appearing, NAD NEURO/PSYCH:  Alert and oriented x 3, no focal deficits EYES:  eyes equal and reactive ENT/NECK:  no LAD, no JVD CARDIO: Regular rate, well-perfused, normal S1 and S2 PULM:  CTAB no wheezing or rhonchi GI/GU:  non-distended, non-tender MSK/SPINE:  No gross deformities, no edema SKIN:  no rash, atraumatic   *Additional and/or pertinent findings included in MDM below  Diagnostic and Interventional Summary    EKG Interpretation Date/Time:    Ventricular Rate:    PR Interval:    QRS Duration:    QT Interval:    QTC Calculation:   R Axis:      Text Interpretation:         Labs Reviewed - No  data to display  No orders to display    Medications  naproxen (NAPROSYN) tablet 500 mg (has no administration in time range)     Procedures  /  Critical Care Procedures  ED Course and Medical Decision Making  Initial Impression and Ddx Once wax removed, despite being comfortable with the plan for discharge and use of Debrox at home an hour ago.  Past medical/surgical history that increases complexity of ED encounter: None  Interpretation of Diagnostics Laboratory and/or imaging options to aid in the diagnosis/care of the patient were considered.  After careful history and physical examination, it was determined that there was no indication for diagnostics at this time.  Patient Reassessment and Ultimate Disposition/Management     Wax removed by nursing, discharge.  Patient management required discussion with the following services or consulting  groups:  None  Complexity of Problems Addressed Acute complicated illness or Injury  Additional Data Reviewed and Analyzed Further history obtained from: None  Additional Factors Impacting ED Encounter Risk None  Elmer Sow. Pilar Plate, MD [facility] Emergency Medicine [facility] [email or web address]  Final Clinical Impressions(s) / ED Diagnoses     ICD-10-CM   1. Bilateral impacted cerumen  [phone]       ED Discharge Orders     None        Discharge Instructions Discussed with and Provided to Patient:    Discharge Instructions      You were evaluated in the Emergency Department and after careful evaluation, we did not find any emergent condition requiring admission or further testing in the hospital.  Your exam/testing today was overall reassuring.  Use the drops.  Please return to the Emergency Department if you experience any worsening of your condition.  Thank you for allowing [place] to be a part of your care.       Sabas Sous, MD 11/10/23 [phone]

## 2023-11-10 NOTE — ED Provider Notes (Signed)
[facility] Emergency Department Provider Note MRN:  [id number]  Arrival date & time: 11/10/23     Chief Complaint   Otalgia   History of Present Illness   Ralph Sheppard is a 27 y.o. year-old male with no pertinent past medical history presenting to the ED with chief complaint of otalgia.  Sharp right ear pain for the past 24 hours, no fever, no other complaints.  Review of Systems  A thorough review of systems was obtained and all systems are negative except as noted in the HPI and PMH.   Patient's Health History    Past Medical History:  Diagnosis Date   ADHD    Asthma     Past Surgical History:  Procedure Laterality Date   WISDOM TOOTH EXTRACTION      Family History  Problem Relation Age of Onset   Asthma Mother    Diabetes Mother    Diabetes Father    Hypertension Father     Social History   Socioeconomic History   Marital status: Single    Spouse name: Not on file   Number of children: Not on file   Years of education: Not on file   Highest education level: Not on file  Occupational History   Not on file  Tobacco Use   Smoking status: Never   Smokeless tobacco: Never  Vaping Use   Vaping status: Never Used  Substance and Sexual Activity   Alcohol use: Never   Drug use: Never   Sexual activity: Not Currently  Other Topics Concern   Not on file  Social History Narrative   Not on file   Social Determinants of Health   Financial Resource Strain: Not on file  Food Insecurity: Food Insecurity Present (08/07/2023)   Hunger Vital Sign    Worried About Running Out of Food in the Last Year: Sometimes true    Ran Out of Food in the Last Year: Sometimes true  Transportation Needs: No Transportation Needs (08/07/2023)   PRAPARE - Administrator, Civil Service (Medical): No    Lack of Transportation (Non-Medical): No  Physical Activity: Not on file  Stress: Not on file  Social Connections: Not on file  Intimate  Partner Violence: Not At Risk (08/07/2023)   Humiliation, Afraid, Rape, and Kick questionnaire    Fear of Current or Ex-Partner: No    Emotionally Abused: No    Physically Abused: No    Sexually Abused: No     Physical Exam   Vitals:   11/10/23 0045  BP: 123/89  Pulse: 64  Resp: 17  Temp: 97.8 F (36.6 C)  SpO2: 99%    CONSTITUTIONAL: Well-appearing, NAD NEURO/PSYCH:  Alert and oriented x 3, no focal deficits EYES:  eyes equal and reactive ENT/NECK:  no LAD, no JVD CARDIO: Regular rate, well-perfused, normal S1 and S2 PULM:  CTAB no wheezing or rhonchi GI/GU:  non-distended, non-tender MSK/SPINE:  No gross deformities, no edema SKIN:  no rash, atraumatic   *Additional and/or pertinent findings included in MDM below  Diagnostic and Interventional Summary    EKG Interpretation Date/Time:    Ventricular Rate:    PR Interval:    QRS Duration:    QT Interval:    QTC Calculation:   R Axis:      Text Interpretation:         Labs Reviewed - No data to display  No orders to display    Medications - No data  to display   Procedures  /  Critical Care Procedures  ED Course and Medical Decision Making  Initial Impression and Ddx Bilateral cerumen impaction on exam.  This is the likely explanation of the otalgia.  No pain to the mastoid or with movement of the pinna.  Past medical/surgical history that increases complexity of ED encounter: None  Interpretation of Diagnostics Laboratory and/or imaging options to aid in the diagnosis/care of the patient were considered.  After careful history and physical examination, it was determined that there was no indication for diagnostics at this time.  Patient Reassessment and Ultimate Disposition/Management     Discharge  Patient management required discussion with the following services or consulting groups:  None  Complexity of Problems Addressed Acute complicated illness or Injury  Additional Data Reviewed and  Analyzed Further history obtained from: Further history from spouse/family member  Additional Factors Impacting ED Encounter Risk Prescriptions  Elmer Sow. Pilar Plate, MD [facility] Emergency Medicine [facility] [email or web address]  Final Clinical Impressions(s) / ED Diagnoses     ICD-10-CM   1. Bilateral impacted cerumen  H61.23       ED Discharge Orders          Ordered    carbamide peroxide (DEBROX) 6.5 % OTIC solution  2 times daily        11/10/23 0040             Discharge Instructions Discussed with and Provided to Patient:    Discharge Instructions      You were evaluated in the Emergency Department and after careful evaluation, we did not find any emergent condition requiring admission or further testing in the hospital.  Your exam/testing today is overall reassuring.  Your ears are full of wax.  Use the Debrox drops as prescribed.  Please return to the Emergency Department if you experience any worsening of your condition.   Thank you for allowing [place] to be a part of your care.      Sabas Sous, MD 11/10/23 [phone]

## 2023-11-10 NOTE — ED Triage Notes (Signed)
Pt reports sharp pain in R ear since yesterday. He states his mother cleans his ears from time to time, and that there is dirt deep in it. Denies any other sx.

## 2023-11-10 NOTE — Discharge Instructions (Signed)
You were evaluated in the Emergency Department and after careful evaluation, we did not find any emergent condition requiring admission or further testing in the hospital.  Your exam/testing today is overall reassuring.  Your ears are full of wax.  Use the Debrox drops as prescribed.  Please return to the Emergency Department if you experience any worsening of your condition.   Thank you for allowing [place] to be a part of your care.

## 2023-11-10 NOTE — ED Triage Notes (Signed)
Patient with pain to R ear w/ wax build up per patient. Here to have it removed. Was seen an hour earlier for same.

## 2023-11-10 NOTE — Discharge Instructions (Signed)
You were evaluated in the Emergency Department and after careful evaluation, we did not find any emergent condition requiring admission or further testing in the hospital.  Your exam/testing today was overall reassuring.  Use the drops.  Please return to the Emergency Department if you experience any worsening of your condition.  Thank you for allowing [place] to be a part of your care.

## 2023-11-10 NOTE — ED Triage Notes (Signed)
Attempted to call pt for triage. Pt in bathroom.

## 2023-11-12 ENCOUNTER — Encounter (HOSPITAL_COMMUNITY): Payer: Self-pay | Admitting: Emergency Medicine

## 2023-11-12 ENCOUNTER — Ambulatory Visit (HOSPITAL_COMMUNITY)
Admission: EM | Admit: 2023-11-12 | Discharge: 2023-11-12 | Disposition: A | Discharge status: Home / Self Care | Payer: MEDICAID | Attending: Emergency Medicine | Admitting: Emergency Medicine

## 2023-11-12 DIAGNOSIS — H6123 Impacted cerumen, bilateral: Secondary | ICD-10-CM

## 2023-11-12 MED ORDER — OFLOXACIN 0.3 % OT SOLN: 10.0000 [drp] | Freq: Every day | OTIC | 0 refills | Status: AC

## 2023-11-12 NOTE — ED Triage Notes (Signed)
PT c/o left pain and fullness. States he went to ER last night and had wax removed from right ear. He has tried Insurance account manager.

## 2023-11-12 NOTE — ED Provider Notes (Signed)
[facility]    CSN: [id number] Arrival date & time: 11/12/23  1738      History   Chief Complaint Chief Complaint  Patient presents with   Ear Fullness    HPI Ralph Sheppard is a 27 y.o. male.    Patient presents to clinic complaining of left ear pain.  Mother is in the room and reports he is having bilateral ear pain.  He was recently seen at the emergency department on 11/18 (twice) for bilateral cerumen impaction.  They attempted to irrigate his right ear did not get any cerumen from it.  He was sent home with Debrox drops and he has been using these daily.  His mother normally cleans his ears, does use Q-tips.  The history is provided by the patient and medical records.  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 Authorizing Provider  ofloxacin (FLOXIN) 0.3 % OTIC solution Place 10 drops into the left ear daily for 7 days. 11/12/23 11/19/23 Yes Rinaldo Ratel, [place] N, FNP  carbamide peroxide (DEBROX) 6.5 % OTIC solution Place 5 drops into both ears 2 (two) times daily. 11/10/23   Sabas Sous, MD  sertraline (ZOLOFT) 25 MG tablet Take 3 tablets (75 mg total) by mouth daily. 08/12/23   Rex Kras, MD  traZODone (DESYREL) 50 MG tablet Take 1 tablet (50 mg total) by mouth at bedtime as needed  for sleep. 08/12/23   Rex Kras, MD    Family History Family History  Problem Relation Age of Onset   Asthma Mother    Diabetes Mother    Diabetes Father    Hypertension Father     Social History Social History   Tobacco Use   Smoking status: Never   Smokeless tobacco: Never  Vaping Use   Vaping status: Never Used  Substance Use Topics   Alcohol use: Never   Drug use: Never     Allergies   Patient has no known allergies.   Review of Systems Review of Systems  Per HPI   Physical Exam Triage Vital Signs ED Triage Vitals  Encounter Vitals Group     BP 11/12/23 1823 110/69     Systolic BP Percentile --      Diastolic BP Percentile --      Pulse Rate 11/12/23 1823 63     Resp 11/12/23 1823 16     Temp 11/12/23 1823 98.2 F (36.8 C)     Temp Source 11/12/23 1823 Oral     SpO2 11/12/23 1823 98 %     Weight --      Height --      Head Circumference --      Peak Flow --      Pain Score 11/12/23 1827 7  Pain Loc --      Pain Education --      Exclude from Growth Chart --    No data found.  Updated Vital Signs BP 110/69 (BP Location: Left Arm)   Pulse 63   Temp 98.2 F (36.8 C) (Oral)   Resp 16   SpO2 98%   Visual Acuity Right Eye Distance:   Left Eye Distance:   Bilateral Distance:    Right Eye Near:   Left Eye Near:    Bilateral Near:     Physical Exam Vitals and nursing note reviewed.  Constitutional:      Appearance: Normal appearance.  HENT:     Head: Normocephalic and atraumatic.     Right Ear: There is impacted cerumen.     Left Ear: There is impacted cerumen.     Nose: Nose normal.     Mouth/Throat:     Mouth: Mucous membranes are moist.  Eyes:     Conjunctiva/sclera: Conjunctivae normal.  Cardiovascular:     Rate and Rhythm: Normal rate.  Pulmonary:     Effort: Pulmonary effort is normal. No respiratory distress.  Musculoskeletal:        General: Normal range of motion.  Skin:    General: Skin is warm and dry.   Neurological:     General: No focal deficit present.     Mental Status: He is alert.  Psychiatric:        Mood and Affect: Mood normal.      UC Treatments / Results  Labs (all labs ordered are listed, but only abnormal results are displayed) Labs Reviewed - No data to display  EKG   Radiology No results found.  Procedures Procedures (including critical care time)  Medications Ordered in UC Medications - No data to display  Initial Impression / Assessment and Plan / UC Course  I have reviewed the triage vital signs and the nursing notes.  Pertinent labs & imaging results that were available during my care of the patient were reviewed by me and considered in my medical decision making (see chart for details).  Vitals and triage reviewed, patient is hemodynamically stable.  Bilateral cerumen impaction on physical exam.  Staff to perform irrigation.  Staff from right sided earwax impaction.  Unable to fully remove the left.  I manually irrigated the canal multiple times with some improvement, impaction remains.  Canal is erythematous, may be from wax buildup.  Will cover with ofloxacin.  Continue Debrox.  Return to clinic as needed or follow-up with ENT.  Mother and patient verbalized understanding, no questions at this time.     Final Clinical Impressions(s) / UC Diagnoses   Final diagnoses:  Bilateral impacted cerumen     Discharge Instructions      We got the earwax out of your right ear.  There is still some remaining in the left.  Please use the ofloxacin eardrops once daily in that left ear for the next 7 days and continue to use the Debrox.  You can use a small amount of consider Ticer to the external ear canal daily to help dry up the wax.  For any pain you can alternate between 800 mg of ibuprofen and 500 mg of Tylenol.     ED Prescriptions     Medication Sig Dispense Auth. Provider   ofloxacin (FLOXIN) 0.3 % OTIC solution Place 10 drops into the left ear  daily for 7 days. 5 mL Mazi Brailsford, [place], [place]  PDMP not reviewed this encounter.   Torii Royse, [place] N, [place] 11/12/23 1944

## 2023-11-12 NOTE — Discharge Instructions (Addendum)
We got the earwax out of your right ear.  There is still some remaining in the left.  Please use the ofloxacin eardrops once daily in that left ear for the next 7 days and continue to use the Debrox.  You can use a small amount of consider Ticer to the external ear canal daily to help dry up the wax.  For any pain you can alternate between 800 mg of ibuprofen and 500 mg of Tylenol.
